# Patient Record
Sex: Male | Born: 1960 | Race: White | Hispanic: No | Marital: Married | Smoking: Never smoker
Health system: Southern US, Community
[De-identification: ages and names within clinical notes are randomized; demographics above are authoritative.]

## PROBLEM LIST (undated history)

## (undated) DIAGNOSIS — N200 Calculus of kidney: Secondary | ICD-10-CM

## (undated) DIAGNOSIS — I10 Essential (primary) hypertension: Secondary | ICD-10-CM

## (undated) HISTORY — PX: HERNIA REPAIR: SHX51

## (undated) HISTORY — PX: NECK SURGERY: SHX720

## (undated) HISTORY — PX: KNEE ARTHROSCOPY: SUR90

## (undated) HISTORY — PX: ROTATOR CUFF REPAIR: SHX139

## (undated) HISTORY — PX: FOOT SURGERY: SHX648

---

## 1999-12-18 ENCOUNTER — Inpatient Hospital Stay (HOSPITAL_COMMUNITY): Admission: RE | Admit: 1999-12-18 | Discharge: 1999-12-19 | Payer: Self-pay | Admitting: Neurosurgery

## 1999-12-18 ENCOUNTER — Encounter: Payer: Self-pay | Admitting: Neurosurgery

## 2000-01-12 ENCOUNTER — Encounter: Admission: RE | Admit: 2000-01-12 | Discharge: 2000-01-12 | Payer: Self-pay | Admitting: Neurosurgery

## 2000-01-12 ENCOUNTER — Encounter: Payer: Self-pay | Admitting: Neurosurgery

## 2000-02-18 ENCOUNTER — Encounter: Admission: RE | Admit: 2000-02-18 | Discharge: 2000-02-18 | Payer: Self-pay | Admitting: Neurosurgery

## 2000-02-18 ENCOUNTER — Encounter: Payer: Self-pay | Admitting: Neurosurgery

## 2000-04-30 ENCOUNTER — Encounter: Admission: RE | Admit: 2000-04-30 | Discharge: 2000-04-30 | Payer: Self-pay | Admitting: Neurosurgery

## 2000-04-30 ENCOUNTER — Encounter: Payer: Self-pay | Admitting: Neurosurgery

## 2001-02-04 ENCOUNTER — Encounter (HOSPITAL_COMMUNITY): Admission: RE | Admit: 2001-02-04 | Discharge: 2001-03-06 | Payer: Self-pay | Admitting: Preventative Medicine

## 2001-07-28 ENCOUNTER — Ambulatory Visit (HOSPITAL_COMMUNITY): Admission: RE | Admit: 2001-07-28 | Discharge: 2001-07-28 | Payer: Self-pay | Admitting: Pulmonary Disease

## 2003-03-12 ENCOUNTER — Encounter: Payer: Self-pay | Admitting: Orthopedic Surgery

## 2003-07-05 ENCOUNTER — Emergency Department (HOSPITAL_COMMUNITY): Admission: EM | Admit: 2003-07-05 | Discharge: 2003-07-05 | Payer: Self-pay | Admitting: Internal Medicine

## 2003-07-06 ENCOUNTER — Ambulatory Visit (HOSPITAL_COMMUNITY): Admission: RE | Admit: 2003-07-06 | Discharge: 2003-07-06 | Payer: Self-pay | Admitting: Pulmonary Disease

## 2003-07-15 ENCOUNTER — Emergency Department (HOSPITAL_COMMUNITY): Admission: EM | Admit: 2003-07-15 | Discharge: 2003-07-16 | Payer: Self-pay | Admitting: *Deleted

## 2004-05-26 ENCOUNTER — Ambulatory Visit: Payer: Self-pay | Admitting: Orthopedic Surgery

## 2004-06-13 ENCOUNTER — Ambulatory Visit (HOSPITAL_COMMUNITY): Admission: RE | Admit: 2004-06-13 | Discharge: 2004-06-13 | Payer: Self-pay | Admitting: Orthopedic Surgery

## 2008-02-13 ENCOUNTER — Encounter: Admission: RE | Admit: 2008-02-13 | Discharge: 2008-02-13 | Payer: Self-pay | Admitting: Neurosurgery

## 2009-06-18 ENCOUNTER — Ambulatory Visit: Payer: Self-pay | Admitting: Orthopedic Surgery

## 2009-06-18 DIAGNOSIS — IMO0002 Reserved for concepts with insufficient information to code with codable children: Secondary | ICD-10-CM | POA: Insufficient documentation

## 2009-06-18 DIAGNOSIS — M1710 Unilateral primary osteoarthritis, unspecified knee: Secondary | ICD-10-CM

## 2009-06-18 DIAGNOSIS — M23302 Other meniscus derangements, unspecified lateral meniscus, unspecified knee: Secondary | ICD-10-CM | POA: Insufficient documentation

## 2009-06-20 ENCOUNTER — Telehealth: Payer: Self-pay | Admitting: Orthopedic Surgery

## 2009-06-21 ENCOUNTER — Ambulatory Visit (HOSPITAL_COMMUNITY): Admission: RE | Admit: 2009-06-21 | Discharge: 2009-06-21 | Payer: Self-pay | Admitting: Orthopedic Surgery

## 2009-06-25 ENCOUNTER — Ambulatory Visit: Payer: Self-pay | Admitting: Orthopedic Surgery

## 2009-06-25 ENCOUNTER — Telehealth: Payer: Self-pay | Admitting: Orthopedic Surgery

## 2009-06-25 DIAGNOSIS — S83419A Sprain of medial collateral ligament of unspecified knee, initial encounter: Secondary | ICD-10-CM | POA: Insufficient documentation

## 2009-06-25 DIAGNOSIS — IMO0002 Reserved for concepts with insufficient information to code with codable children: Secondary | ICD-10-CM | POA: Insufficient documentation

## 2009-06-25 DIAGNOSIS — M2340 Loose body in knee, unspecified knee: Secondary | ICD-10-CM | POA: Insufficient documentation

## 2009-07-03 ENCOUNTER — Ambulatory Visit: Payer: Self-pay | Admitting: Orthopedic Surgery

## 2009-07-03 ENCOUNTER — Ambulatory Visit (HOSPITAL_COMMUNITY): Admission: RE | Admit: 2009-07-03 | Discharge: 2009-07-03 | Payer: Self-pay | Admitting: Orthopedic Surgery

## 2009-07-08 ENCOUNTER — Ambulatory Visit: Payer: Self-pay | Admitting: Orthopedic Surgery

## 2009-07-12 ENCOUNTER — Encounter: Payer: Self-pay | Admitting: Orthopedic Surgery

## 2009-07-18 ENCOUNTER — Encounter: Payer: Self-pay | Admitting: Orthopedic Surgery

## 2009-07-22 ENCOUNTER — Ambulatory Visit: Payer: Self-pay | Admitting: Orthopedic Surgery

## 2009-08-08 ENCOUNTER — Ambulatory Visit: Payer: Self-pay | Admitting: Orthopedic Surgery

## 2009-08-08 ENCOUNTER — Telehealth: Payer: Self-pay | Admitting: Orthopedic Surgery

## 2009-11-13 ENCOUNTER — Ambulatory Visit: Payer: Self-pay | Admitting: Orthopedic Surgery

## 2009-11-13 ENCOUNTER — Telehealth: Payer: Self-pay | Admitting: Orthopedic Surgery

## 2009-11-14 ENCOUNTER — Encounter (INDEPENDENT_AMBULATORY_CARE_PROVIDER_SITE_OTHER): Payer: Self-pay | Admitting: *Deleted

## 2009-11-15 ENCOUNTER — Encounter: Payer: Self-pay | Admitting: Orthopedic Surgery

## 2009-11-25 ENCOUNTER — Ambulatory Visit: Payer: Self-pay | Admitting: Orthopedic Surgery

## 2009-12-02 ENCOUNTER — Ambulatory Visit: Payer: Self-pay | Admitting: Orthopedic Surgery

## 2009-12-09 ENCOUNTER — Ambulatory Visit: Payer: Self-pay | Admitting: Orthopedic Surgery

## 2010-01-23 ENCOUNTER — Telehealth: Payer: Self-pay | Admitting: Orthopedic Surgery

## 2010-05-20 NOTE — Assessment & Plan Note (Signed)
Summary: SYNVISC # 2 INJ/BCBS/BSF   Visit Type:  Follow-up  CC:  knee pain.  History of Present Illness: I saw Eddie Salazar in the office today for a followup visit.  He is a 50 years old man with the complaint of:  left knee  Synvisc injection #2 today.  Doing better.   He is doing well with his first injection no complications he says his knee feels better  We injected the LEFT knee for the second second time with Synvisc  1 vial of synvisc was injected into the left  knee.              Allergies: No Known Drug Allergies   Impression & Recommendations:  Problem # 1:  KNEE, ARTHRITIS, DEGEN./OSTEO (ICD-715.96) Assessment Improved  Orders: Synvisc (20610M)  Patient Instructions: 1)  You have received an injection of cortisone today. You may experience increased pain at the injection site. Apply ice pack to the area for 20 minutes every 2 hours and take 2 xtra strength tylenol every 8 hours. This increased pain will usually resolve in 24 hours. The injection will take effect in 3-10 days.  2)  3rd injection next week

## 2010-05-20 NOTE — Progress Notes (Signed)
Summary: Initial evaluation  Initial evaluation   Imported By: Jacklynn Ganong 06/18/2009 08:45:35  _____________________________________________________________________  External Attachment:    Type:   Image     Comment:   External Document

## 2010-05-20 NOTE — Letter (Signed)
Summary: Out of Work  [facility]  [address]  [place], [place] [address]   Phone: [phone]  Fax: [phone]    July 22, 2009   Employee:  Eddie Salazar    To Whom It May Concern:   For Medical reasons, please excuse the above named employee from work for the following dates:  Continue OOW to April 24  If you need additional information, please feel free to contact our office.         Sincerely,    Fuller Canada MD

## 2010-05-20 NOTE — Assessment & Plan Note (Signed)
Summary: LT KNEE STIFFNESS/SURG 07/03/09/BCBS/CAF   Visit Type:  Follow-up  CC:  left knee stiffness.  History of Present Illness: A 50 year old male status post arthroscopy LEFT knee medial lateral meniscectomies medial femoral condyle chondroplasty complains of knee stiffness at the end of the day and mild medial joint line pain.  DOS 07-03-09.   Medications: he takes Aleve as needed which may help some what  last x-ray was June 18, 2009  He does not complain of catching locking or giving way of stiffness when he crosses his legs or when he flexes his knee    Allergies: No Known Drug Allergies  Physical Exam  Extremities:  LEFT knee and he walks normally  He has medial joint line tenderness has clicking and popping on range of motion his range of motion remains full except for 5 loss of extension his motor strength is grade 5 his knee is stable his meniscal signs are negative his pulse is good his sensation is normal   Impression & Recommendations:  Problem # 1:  KNEE, ARTHRITIS, DEGEN./OSTEO (ICD-715.96) Assessment Comment Only  osteoarthritis worsening recommend Synvisc injections as well as LEFT knee joint injection  Verbal consent was obtained. The knee was prepped with alcohol and ethyl chloride. 1 cc of depomedrol 40mg /cc and 4 cc of lidocaine 1% was injected. there were no complications.  Orders: Est. Patient Level III ([address]) Joint Aspirate / Injection, Large (20610) Depo- Medrol 40mg  (J1030)  Patient Instructions: 1)  You have received an injection of cortisone today. You may experience increased pain at the injection site. Apply ice pack to the area for 20 minutes every 2 hours and take 2 xtra strength tylenol every 8 hours. This increased pain will usually resolve in 24 hours. The injection will take effect in 3-10 days.  2)  take script to pharmacy and call us when you get it to have the medicine injected over 3 weeks time

## 2010-05-20 NOTE — Letter (Signed)
Summary: FMLA form  FMLA form   Imported By: Jacklynn Ganong 08/06/2009 10:27:14  _____________________________________________________________________  External Attachment:    Type:   Image     Comment:   External Document

## 2010-05-20 NOTE — Medication Information (Signed)
Summary: RX Authorization Synv  RX Authorization Synv   Imported By: Cammie Sickle 11/15/2009 11:34:29  _____________________________________________________________________  External Attachment:    Type:   Image     Comment:   External Document

## 2010-05-20 NOTE — Letter (Signed)
Summary: Work note  [facility]  [address]  [place], [place] [address]   Phone: [phone]  Fax: [phone]    June 18, 2009   Employee:  Eddie Salazar    To Whom It May Concern:   For Medical reasons, please note above named employee was seen in our office today for an appointment, June 18, 2009.   Please note the following:  Start:   June 18, 2009 - No lifting on the truck.  End:   Until further notice.  If you need additional information, please feel free to contact our office.         Sincerely,   Terrance Mass, MD

## 2010-05-20 NOTE — Assessment & Plan Note (Signed)
Summary: MRI results from ap/frs   Visit Type:  Follow-up  CC:  left knee pain.  History of Present Illness: This is a 50 year old male with previous history of osteochondritis dissecans of the LEFT knee treated in 1981 at [facility]  with a graft/open treatment internal fixation procedure and presents now with injury 2 weeks ago, while at work. He was walking slid lost his footing and injured his LEFT knee.  He complains of medial pain. Minimal swelling painful weightbearing. No radiation. The pain is sharp moderate in severity and constant when he gets out of a seated position and starts walking. He has catching.  We injected his knee and gave him a brace to wear and that helped him over the last week but he still has basically the same symptoms.  His MRI shows:  1.  Possible grade 1 sprain of the MCL. 2.  Extensive chondromalacia of the knee joint with multiple loose bodies in the joint including a loose body lying anterior and under the posterior horn of the lateral meniscus. 3.  Horizontal tear of the undersurface of the mid body and posterior horn of the medial meniscus. 4.   there is also 3 compartment arthritis    He needs to have arthroscopic surgery we discussed risk benefit ratio he decided to go ahead with the surgery    Allergies: No Known Drug Allergies  Past History:  Past Medical History: Last updated: 06/18/2009 None  Past Surgical History: Last updated: 06/18/2009 Neck Knee left Foot right shoulder  Family History: FH of Cancer:   Social History: Patient is married.   Works at [employer] and does a lot of lifting  Review of Systems Musculoskeletal:  See HPI.  The review of systems is negative for Constitutional, Cardiovascular, Respiratory, Gastrointestinal, Genitourinary, Neurologic, Endocrine, Psychiatric, Skin, HEENT, Immunology, and Hemoatologic.  Physical Exam  Additional Exam:  GEN: appearance was normal   CDV:  normal pulses temperature and no edema  LYMPH nodes were normal   SKIN was normal   Neuro: normal sensation Psyche: AAO x 3 and mood was normal   MSK *Gait was abnormal, he is limping  Inspection reveals that the LEFT knee has a joint effusion. His flexion is 120. He has significant medial joint line tenderness. A slightly positive McMurray sign, a positive hyper extension sign. He doesn't come to full extension with a 5 block. His motor exam is normal. His knee is stable.  His RIGHT knee is nontender with full range of motion, grade 5 strength, and no joint laxity   The upper extremities have normal appearance, ROM, no joint laxity and mild supraspinatus weakness in the RIGHT shoulder related to her previous cuff repair it is of no functional significance   Impression & Recommendations:  Problem # 1:  KNEE, ARTHRITIS, DEGEN./OSTEO (EAV-409.81)  Orders: Est. Patient Level III (19147)  Problem # 2:  TEAR MEDIAL MENISCUS (ICD-836.0)  Orders: Est. Patient Level III (82956)  Problem # 3:  TEAR M C L (ICD-844.1)  Orders: Est. Patient Level III (21308)  Problem # 4:  LOOSE BODY-KNEE (ICD-717.6)  plan arthroscopy LEFT knee partial medial meniscectomy and removal of loose bodies.  No plan to address any arthritic changes unless there are chondral flap tears.  Orders: Est. Patient Level III (65784)  Patient Instructions: 1)  Schedule post op visit

## 2010-05-20 NOTE — Progress Notes (Signed)
Summary: [facility] Rx question  Phone Note From Pharmacy   Caller: Pharmacy Summary of Call: Rhi from [facility] called to verify Synvisc Rx; asked if pre-filled syringes okay?  Also verify'g strength. States they will be running his insurance after confirming Rx Initial call taken by: Cammie Sickle,  November 13, 2009 11:43 AM  Follow-up for Phone Call        per Dr Romeo Apple -pre-filled syringes okay and need  3 pins of 2 ml's. Follow-up by: Cammie Sickle,  November 13, 2009 11:50 AM

## 2010-05-20 NOTE — Assessment & Plan Note (Signed)
Summary: RE-CK/POST OP LT KNEE/SURG 07/03/09/BCBS/CAF   Visit Type:  Follow-up  CC:  post op left knee.Eddie Salazar  History of Present Illness: a 50 years old status post arthroscopy LEFT knee medial and lateral meniscectomies and a chondroplasty of the medial femoral condyle DOS 07-03-09. Arthoscopy, left knee: medial and lateral menisectomies, MFC PLASTY  Medications: none.  Doing well.  Is ready for work.  Normal knee exam    Allergies: No Known Drug Allergies   Other Orders: Post-Op Check (09811)  Patient Instructions: 1)  Please schedule a follow-up appointment as needed. 2)  return to work

## 2010-05-20 NOTE — Progress Notes (Signed)
Summary: cancelled for today  Phone Note Call from Patient   Summary of Call: Eddie Salazar says his knee is doing fine, he is in class now and not on his feet as much.  He cancelled the appointment for today because he does not have any insurance.  He will be covered by mid December and said if you need him to follow-up he will schedule after insurance goes into effect. Initial call taken by: Jacklynn Ganong,  January 23, 2010 7:57 AM

## 2010-05-20 NOTE — Miscellaneous (Signed)
Summary: authorization approved for synvisc exp 12/14/09  Clinical Lists Changes

## 2010-05-20 NOTE — Progress Notes (Signed)
Summary: Progress note  Progress note   Imported By: Jacklynn Ganong 06/18/2009 08:46:26  _____________________________________________________________________  External Attachment:    Type:   Image     Comment:   External Document

## 2010-05-20 NOTE — Letter (Signed)
Summary: surgery order LT knee sched 07/03/09  surgery order LT knee sched 07/03/09   Imported By: Cammie Sickle 10/12/2009 13:29:40  _____________________________________________________________________  External Attachment:    Type:   Image     Comment:   External Document

## 2010-05-20 NOTE — Assessment & Plan Note (Signed)
Summary: sYNVISC #3 INJECTION/BCBS/BSF   Visit Type:  Follow-up  CC:  knee pain.  History of Present Illness: 50 year old male for his third Synvisc injection today.  The medicine was ordered from the pharmacy.  He reports that his knee is much better.  He said he did try to run up some stairs and had a little difficulty doing that but otherwise his knee has been feeling great.   third Synvisc injection was given lateral approach LEFT knee sterile conditions no complications          Allergies: No Known Drug Allergies   Impression & Recommendations:  Problem # 1:  KNEE, ARTHRITIS, DEGEN./OSTEO (ICD-715.96) Assessment Improved  1 vial of synvisc was injected into the knee.  Orders: Joint Aspirate / Injection, Large (20610) Synvisc (20610M)  Patient Instructions: 1)  Return in 6 weeks [post 3rd synvisc injection] 2)  Try the plantar fasciitis exercises

## 2010-05-20 NOTE — Medication Information (Signed)
Summary: Tax adviser   Imported By: Cammie Sickle 11/15/2009 11:32:55  _____________________________________________________________________  External Attachment:    Type:   Image     Comment:   External Document

## 2010-05-20 NOTE — Progress Notes (Signed)
Summary: wants to rtw 08/10/09  Phone Note Call from Patient   Summary of Call: Eddie Salazar (21-Oct-2060)asked if he can return to work Saturday 08/10/09 instead of 08/12/09. Will only be working 6 hours on 08/10/09 and will resume  regular hours 08/12/09. If ok'd asked [place] to fax the note to Gerrit Friends at [facility] [employer] improvement fax # [phone]. Benjimin's # [id number] Initial call taken by: Jacklynn Ganong,  August 08, 2009 10:37 AM  Follow-up for Phone Call        ok Follow-up by: Fuller Canada MD,  August 08, 2009 10:42 AM

## 2010-05-20 NOTE — Letter (Signed)
Summary: Out of Work Updated  [facility]  [address]  [place], [place] [address]   Phone: [phone]  Fax: [phone]    August 08, 2009   Employee:  Eddie Salazar    To Whom It May Concern:   For Medical reasons, please note that the above named employee has been released to return to work as follows:  August 10, 2009, no restrictions, full duty   If you need additional information, please feel free to contact our office.         Sincerely,    Terrance Mass, MD

## 2010-05-20 NOTE — Assessment & Plan Note (Signed)
Summary: SYNVISC INJEC #1 LT KNEE/BCBS/CAF   Visit Type:  Follow-up  CC:  left knee pain.  History of Present Illness: I saw Eddie Salazar in the office today for a followup visit.  He is a 50 years old man with the complaint of:  left knee  Synvisc injection #1 today.  The patient is having pain and loss of function in the knee. Other conservative measures have failed (nsaids, rest, exercise, attempts at wt loss, arthroscopic surgery)   Normal Appearance, Oriented x 3, Mood normal Inspection medial joint line tenderness   ROM 5-125   Stability was normal   Motor grade was 5  Skin was intact no rash     1 vial of synvisc was injected into the LEFT knee.    Allergies: No Known Drug Allergies   Impression & Recommendations:  Problem # 1:  KNEE, ARTHRITIS, DEGEN./OSTEO (ICD-715.96) Assessment Unchanged  Orders: Joint Aspirate / Injection, Large ([address]) Synvisc (20610M) Synvisc-Three (16 units) (U0454)  Patient Instructions: 1)  Make next 2 appointments now [each 1 week apart]

## 2010-05-20 NOTE — Assessment & Plan Note (Signed)
Summary: POST OP 1/LT KNEE SURG 07/03/09/BCBS/CAF   Visit Type:  post op #1  CC:  post op left knee.  History of Present Illness: POST OP VISIT DAY # 5  DOS 07-03-09. Arthoscopy, left knee  Medications: Hydrocodone  C/O SORENESS EVERY WHERE EXCEPT THE KNEE , THE KNEE IS STIFF     He has a small flexion contracture about 5-7, has a small effusion. His flexion is about 100  the knee looks good otherwise.  In home exercise program, including, prone hangs, follow up 2 weeks    The stitches were removed from the knee. There is a small joint effusion. He has a chronic7 flexion contracture.   Allergies: No Known Drug Allergies   Impression & Recommendations:  Problem # 1:  KNEE, ARTHRITIS, DEGEN./OSTEO (ICD-715.96) Assessment Comment Only  Problem # 2:  TEAR MEDIAL MENISCUS (ICD-836.0) Assessment: Comment Only  postop stable  Other Orders: Post-Op Check ([address])  Patient Instructions: 1)  Home exercise program  2)  return in 2 weeks

## 2010-05-20 NOTE — Letter (Signed)
Summary: Out of Work  [facility]  [address]  [place], [place] [address]   Phone: [phone]  Fax: [phone]    August 08, 2009   Employee:  SONIA STICKELS    To Whom It May Concern:   For Medical reasons, Return to work   August 12, 2009 with no restrictions    If you need additional information, please feel free to contact our office.         Sincerely,    Fuller Canada MD

## 2010-05-20 NOTE — Assessment & Plan Note (Signed)
Summary: POST OP 2/2 WK RE-CK LT KNEE/BCBS/CAF   Visit Type:  post op  CC:  left knee.  History of Present Illness: I saw Eddie Salazar in the office today for a 3 week  post op followup visit.   DOS 07-03-09. Arthoscopy, left knee: medial and lateral menisectomies, MFC PLASTY  Medications: none.  c/o giving way and stairs are difficult.  ROM is better full flexion and has a mild extension deficit  Eddie Salazar is doing well. He is improving. However, yesterday 100%. He goes back to work, which requires bending, lifting, and standing and lifting heavy equipment so I think he would need another 3, weeks. I'll see him on the 21st of reassess    Allergies: No Known Drug Allergies   Other Orders: Post-Op Check (81191)  Patient Instructions: 1)  April 21 follow up  2)  continue exercises at home  3)  wear sleeve on left leg

## 2010-05-20 NOTE — Letter (Signed)
Summary: Out of Work Letter 07/08/09  [facility]  [address]  [place], [place] [address]   Phone: [phone]  Fax: [phone]    July 08, 2009   Employee:  Eddie Salazar    To Whom It May Concern:   For Medical reasons, please excuse the above named employee from work for the following dates:  Start:   July 03, 2009, secondary to surgery  End/Estimate Return to work date:  August 14, 2009   (Next scheduled appointment:  July 22, 2009)    If you need additional information, please feel free to contact our office.         Sincerely,    Terrance Mass, MD

## 2010-05-20 NOTE — Letter (Signed)
Summary: History form  History form   Imported By: Jacklynn Ganong 06/25/2009 09:53:33  _____________________________________________________________________  External Attachment:    Type:   Image     Comment:   External Document

## 2010-05-20 NOTE — Assessment & Plan Note (Signed)
Summary: KNEE PAIN NEEDS XR/BCBS/BSF   Visit Type:  new name  CC:  left knee pain.  History of Present Illness: this is a 50 year old male with a history of osteochondritis dissecans of the LEFT knee [facility] in [facility] with a graft/open treatment internal fixation. Procedure and presents now with injury 2 weeks ago, while at work. He was walking slid lost his footing and injured his LEFT knee.  He complains of medial pain. Minimal swelling painful weightbearing. No radiation. The pain is sharp moderate in severity and constant when he gets out of a seated position and starts walking. He has catching.   Xrays today in our office.  Medications: Aleve when needed.  Allergies: No Known Drug Allergies  Past History:  Past Surgical History: Neck Knee left Foot right shoulder  Review of Systems Constitutional:  Denies weight loss, weight gain, fever, chills, and fatigue. Cardiovascular:  Denies chest pain, angina, heart attack, heart failure, poor circulation, blood clots, and phlebitis. Respiratory:  Denies short of breath, difficulty breathing, COPD, cough, and pneumonia. Gastrointestinal:  Denies nausea, vomiting, diarrhea, constipation, difficulty swallowing, ulcers, GERD, and reflux. Genitourinary:  Denies kidney failure, kidney transplant, kidney stones, burning, poor stream, testicular cancer, blood in urine, and . Neurologic:  Denies headache, dizziness, migraines, numbness, weakness, tremor, and unsteady walking. Musculoskeletal:  Complains of joint pain and joint swelling; denies rheumatoid arthritis, gout, bone cancer, osteoporosis, and . Endocrine:  Denies thyroid disease, goiter, and diabetes. Psychiatric:  Denies depression, mood swings, anxiety, panic attack, bipolar, and schizophrenia. Skin:  Denies eczema, cancer, and itching. HEENT:  Denies poor vision, cataracts, glaucoma, poor hearing, vertigo, ears ringing, sinusitis, hoarseness, toothaches, and  bleeding gums. Immunology:  Denies seasonal allergies, sinus problems, and allergic to bee stings. Hemoatologic:  Denies lymph node cancer and lymph edema.  Physical Exam  Additional Exam:  GEN: appearance was normal   CDV: normal pulses temperature and no edema  LYMPH nodes were normal   SKIN was normal   Neuro: normal sensation Psyche: AAO x 3 and mood was normal   MSK *Gait was abnormal, he is limping  Inspection reveals that the LEFT knee has a joint effusion. His flexion is 120. He has significant medial joint line tenderness. A slightly positive McMurray sign, a positive hyper extension sign. He doesn't come to full extension with a 5 block. His motor exam is normal. His knee is stable.  His RIGHT knee is nontender with full range of motion, grade 5 strength, and no joint laxity      Impression & Recommendations:  Problem # 1:  DERANGEMENT MENISCUS (ICD-717.5) Assessment New  Orders: New Patient Level III ([address]) Knee x-ray,  3 views (73562)/ordered.  He has squaring of the medial femoral condyle. It was enlarged. This questionable halo aside from previous OCD surgery. He has maintained his joint spaces with mild medial joint space narrowing and some osteophytes medially.  Impression medial gonarthrosis.  Recommend MRI to evaluate for meniscal tear  Problem # 2:  KNEE, ARTHRITIS, DEGEN./OSTEO (ICD-715.96) Assessment: New  Orders: New Patient Level III (60454) Knee x-ray,  3 views (09811)  Patient Instructions: 1)  MRI LEFT KNEE  2)  NOTE FOR WORK: NO LIFTING ON THE TRUCK  3)  APPLY ICE TO THE KNEE three times a day  4)  FOR 30 MINUTES [OR AS MANY TIMES AS YOU CAN]

## 2010-05-20 NOTE — Progress Notes (Signed)
Summary: No pre-authorization required for out-patient procedure  Phone Note Outgoing Call   Call placed to: Insurer Summary of Call: Contacted insurer [employer] re: out-patient procedure scheduled 07/03/09 at [facility] LT knee, CPT 29881/29880. No pre-cert is required for out-patient surgery per Laquita. Initial call taken by: Cammie Sickle,  June 25, 2009 6:50 PM

## 2010-05-20 NOTE — Progress Notes (Signed)
Summary: MRI appointment.  Phone Note Outgoing Call   Call placed by: Waldon Reining,  June 20, 2009 3:14 PM Call placed to: Patient Action Taken: Phone Call Completed, Appt scheduled Summary of Call: I called to give the patient his MRI appointment at [facility] on 06-21-09 at 7:30 pm. Patient has BCBS, no precert is needed. Patient will follow up back here for his results.

## 2010-07-13 LAB — BASIC METABOLIC PANEL
BUN: 10 mg/dL (ref 6–23)
CO2: 28 mEq/L (ref 19–32)
Chloride: 102 mEq/L (ref 96–112)
Creatinine, Ser: 0.86 mg/dL (ref 0.4–1.5)
Glucose, Bld: 120 mg/dL — ABNORMAL HIGH (ref 70–99)
Potassium: 3.8 mEq/L (ref 3.5–5.1)

## 2010-09-05 NOTE — H&P (Signed)
[place]  Patient:    Eddie Salazar, Eddie Salazar                      MRN: [id number] Adm. Date:  [id number] Attending:  Barton Fanny CC:         Hewitt Shorts, M.D.   History and Physical  CHIEF COMPLAINT: The patient is a 50 year old right-handed white male evaluated for left cervical radiculopathy secondary to cervical disk herniation.  HISTORY OF PRESENT ILLNESS: He began to have symptoms over the last couple of months, without any particular cause of onset.  He began to notice weakness in his left upper extremity as well as numbness and tingling in his left thumb that gradually extended to the left index finger as well.  At work he often has to do lifting with both his upper extremities and he has found that he is able to lift much better with his right than his left upper extremity.  He has had some neck stiffness occasionally with coughing and will have sharp pain that will shoot down and around the left scapula and down through the left shoulder, arm, forearm, and hand.  He has discomfort and pain in the base of his neck.  He denies any right upper extremity symptoms.  He has been taking Relafen without relief or improvement.  PAST MEDICAL HISTORY: No history of hypertension, myocardial infarction, cancer, stroke, diabetes, peptic ulcer disease, or lung disease.  PAST SURGICAL HISTORY:  1. Left knee arthroscopy.  2. ORIF of left femur.  3. Repair of left foot tendon and artery injury from a chain saw accident.  4. Right bunionectomy.  5. Bilateral inguinal herniorrhaphy.  ALLERGIES: No known drug allergies.  CURRENT MEDICATIONS: None.  FAMILY HISTORY: Parents are both in good health at age 84.  His mother has hypertension and his father has a history of colon cancer.  There is a family history of hypertension, stroke, and cancer.  SOCIAL HISTORY: The patient is married.  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  He does not smoke.  He does drink alcoholic beverages socially.  He denies history of substance abuse.  REVIEW OF SYSTEMS: Notable for this difficulties described in his History of Present Illness and past medical history, but otherwise unremarkable.  PHYSICAL EXAMINATION:  GENERAL: The patient is a well-developed, well-nourished white male, in no acute distress.  VITAL SIGNS: Temperature 97.2 degrees, pulse 76, blood pressure 140/92, respiratory rate 18.  Height 5 feet 9 inches.  Weight 170 pounds.  LUNGS: Clear to auscultation.  Symmetrical respiratory excursions.  HEART: Regular rate and rhythm.  Normal S1 and S2.  No murmur.  ABDOMEN: Soft, nontender.  Bowel sounds present.  EXTREMITIES: No clubbing, cyanosis, or edema.  MUSCULOSKELETAL: No tenderness to palpation of the cervical spinous processes or paracervical musculature.  Good range of motion of the neck, with some discomfort at the base of his neck with forward flexion and minimal discomfort on lateral flexion to either side.  NEUROLOGIC: Weakness of left triceps, 3/5.  Remainder of upper extremity strength is 5/5 at deltoids and biceps bilaterally as well as right triceps, as well as intrinsics and grip bilaterally.  Sensation diminished to pinprick in  the first and second digits of the left hand but otherwise intact. Reflexes are minimal in the upper extremities except for the right biceps, which is trace to 1.  The quadriceps are trace bilaterally, gastrocnemius minimal bilaterally.  Toes downgoing bilaterally.  LABORATORY DATA: X-rays and MRI scan were reviewed and show mild degenerative disk disease and spondylosis.  The most significant finding on MRI, though, is of central  to left disk herniation at both C5-6 and C6-7.  IMPRESSION: Left C6 and C7 radiculopathy as evidenced by numbness of the first and second digits of the left hand and weakness of the left triceps, secondary to left C5-6 and C6-7 cervical disk herniation superimposed on mild degenerative disk disease and spondylosis.  PLAN: The patient is being admitted for two-level, C5-6 and C6-7, anterior cervical diskectomy and arthrodesis with allograft and cervical plating.  We discussed the typical nature of surgery, length of surgery and hospital stay, and overall recuperation.  We discussed limitations during the postoperative period and the risks of surgery including the risk of infection, bleeding, possible need for transfusion, risk of nerve dysfunction with pain, weakness, numbness, or paresthesias, the risk of spinal cord dysfunction with paralysis of all four limbs or quadriplegia, risk of failure of arthrodesis, anesthetic risks of myocardial infarction, stroke, pneumonia, and death, and understanding all this the patient does wish to proceed with surgery, and is admitted for such. DD:  12/18/99 TD:  12/18/99 Job: [id number]

## 2010-09-05 NOTE — Procedures (Signed)
[facility]  Patient:    Eddie Salazar, Eddie Salazar Visit Number: [id number] MRN: [id number]          Service Type: Attending:  Kari Baars, M.D. Dictated by:   Kari Baars, M.D. Proc. Date: 07/28/01 Disc. Date: 07/28/01                                Stress Test  REASON FOR PROCEDURE:  Chest pain.  BRIEF HISTORY:  The patient has been having chest discomfort and shortness of breath associated with exertion.  He is undergoing graded exercise testing to rule out ischemic cardiac disease. There are no contraindications to grade exercise testing.  The patient exercised for 10 minutes and 15 seconds on the Bruce Protocol reaching sustaining 12.9 minutes.  His maximum recorded heart rate was 173 which was 96% of his age predicted maximal heart rate.  He had no symptoms during exercise.  His blood pressure response to exercise was slightly exaggerated.  There were no electrocardiographic changes suggestive of inducible ischemia.  IMPRESSION: 1. Good exercise tolerance. 2. Somewhat exaggerated blood pressure response to exercise. 3. No symptoms during exercise. 4. No evidence of inducible ischemia. Dictated by:   Kari Baars, M.D. Attending:  Kari Baars, M.D. DD:  07/28/01 TD:  07/30/01 Job: [id number]

## 2011-08-27 ENCOUNTER — Encounter: Payer: Self-pay | Admitting: Internal Medicine

## 2011-08-28 ENCOUNTER — Encounter (INDEPENDENT_AMBULATORY_CARE_PROVIDER_SITE_OTHER): Payer: Self-pay | Admitting: *Deleted

## 2011-11-23 ENCOUNTER — Other Ambulatory Visit (INDEPENDENT_AMBULATORY_CARE_PROVIDER_SITE_OTHER): Payer: Self-pay | Admitting: *Deleted

## 2011-11-23 ENCOUNTER — Encounter (INDEPENDENT_AMBULATORY_CARE_PROVIDER_SITE_OTHER): Payer: Self-pay | Admitting: *Deleted

## 2011-11-23 ENCOUNTER — Telehealth (INDEPENDENT_AMBULATORY_CARE_PROVIDER_SITE_OTHER): Payer: Self-pay | Admitting: *Deleted

## 2011-11-23 DIAGNOSIS — Z1211 Encounter for screening for malignant neoplasm of colon: Secondary | ICD-10-CM

## 2011-11-23 DIAGNOSIS — Z800 Family history of malignant neoplasm of digestive organs: Secondary | ICD-10-CM

## 2011-11-23 MED ORDER — PEG-KCL-NACL-NASULF-NA ASC-C 100 G PO SOLR: 1.0000 | Freq: Once | ORAL | Status: DC

## 2011-11-23 NOTE — Telephone Encounter (Signed)
Patient needs movi prep 

## 2011-12-16 ENCOUNTER — Telehealth (INDEPENDENT_AMBULATORY_CARE_PROVIDER_SITE_OTHER): Payer: Self-pay | Admitting: *Deleted

## 2011-12-16 NOTE — Telephone Encounter (Signed)
PCP/Requesting MD: Juanetta Gosling  Name & DOB: yahia bottger 04/19/2061     Procedure: tcs  Reason/Indication:  Screening, fam hx colon ca  Has patient had this procedure before?  yes  If so, when, by whom and where?  At least 8-10 yrs ago  Is there a family history of colon cancer?  yes  Who?  What age when diagnosed?  Father, grandfather  Is patient diabetic?   no      Does patient have prosthetic heart valve?  no  Do you have a pacemaker?  no  Has patient had joint replacement within last 12 months?  no  Is patient on Coumadin, Plavix and/or Aspirin? no  Medications: lisinopril 10 mg daily  Allergies: nkda  Medication Adjustment:   Procedure date & time: 01/13/12 at 830

## 2011-12-18 NOTE — Telephone Encounter (Signed)
agree

## 2012-01-08 ENCOUNTER — Encounter (HOSPITAL_COMMUNITY): Payer: Self-pay | Admitting: Pharmacy Technician

## 2012-01-12 MED ORDER — SODIUM CHLORIDE 0.45 % IV SOLN
Freq: Once | INTRAVENOUS | Status: AC
  Administered 2012-01-13: 1000 mL via INTRAVENOUS

## 2012-01-13 ENCOUNTER — Encounter (HOSPITAL_COMMUNITY): Payer: Self-pay | Admitting: *Deleted

## 2012-01-13 ENCOUNTER — Encounter (HOSPITAL_COMMUNITY)
Admission: RE | Disposition: A | Discharge status: Home / Self Care | Payer: Self-pay | Source: Ambulatory Visit | Attending: Internal Medicine

## 2012-01-13 ENCOUNTER — Ambulatory Visit (HOSPITAL_COMMUNITY)
Admission: RE | Admit: 2012-01-13 | Discharge: 2012-01-13 | Disposition: A | Discharge status: Home / Self Care | Payer: BC Managed Care – PPO | Source: Ambulatory Visit | Attending: Internal Medicine | Admitting: Internal Medicine

## 2012-01-13 DIAGNOSIS — Z800 Family history of malignant neoplasm of digestive organs: Secondary | ICD-10-CM

## 2012-01-13 DIAGNOSIS — Z1211 Encounter for screening for malignant neoplasm of colon: Secondary | ICD-10-CM | POA: Insufficient documentation

## 2012-01-13 DIAGNOSIS — I10 Essential (primary) hypertension: Secondary | ICD-10-CM | POA: Insufficient documentation

## 2012-01-13 DIAGNOSIS — K644 Residual hemorrhoidal skin tags: Secondary | ICD-10-CM | POA: Insufficient documentation

## 2012-01-13 DIAGNOSIS — D128 Benign neoplasm of rectum: Secondary | ICD-10-CM | POA: Insufficient documentation

## 2012-01-13 DIAGNOSIS — D129 Benign neoplasm of anus and anal canal: Secondary | ICD-10-CM | POA: Insufficient documentation

## 2012-01-13 HISTORY — PX: COLONOSCOPY: SHX5424

## 2012-01-13 HISTORY — DX: Essential (primary) hypertension: I10

## 2012-01-13 SURGERY — COLONOSCOPY
Anesthesia: Moderate Sedation

## 2012-01-13 MED ORDER — STERILE WATER FOR IRRIGATION IR SOLN
Status: DC | PRN
  Administered 2012-01-13: 08:00:00

## 2012-01-13 MED ORDER — MIDAZOLAM HCL 5 MG/5ML IJ SOLN
INTRAMUSCULAR | Status: AC
  Filled 2012-01-13: qty 10

## 2012-01-13 MED ORDER — MIDAZOLAM HCL 5 MG/5ML IJ SOLN
INTRAMUSCULAR | Status: DC | PRN
  Administered 2012-01-13 (×4): 2 mg via INTRAVENOUS

## 2012-01-13 MED ORDER — MEPERIDINE HCL 50 MG/ML IJ SOLN
INTRAMUSCULAR | Status: DC | PRN
  Administered 2012-01-13 (×2): 25 mg via INTRAVENOUS

## 2012-01-13 MED ORDER — MEPERIDINE HCL 50 MG/ML IJ SOLN
INTRAMUSCULAR | Status: AC
  Filled 2012-01-13: qty 1

## 2012-01-13 NOTE — Op Note (Signed)
COLONOSCOPY PROCEDURE REPORT  PATIENT:  [name]  MR#:  [id number] Birthdate:  March 09, 1961, 51 y.o., male Endoscopist:  Dr. Malissa Hippo, MD Referred By:  Dr. Oneal Deputy. Juanetta Gosling, MD Procedure Date: 01/13/2012  Procedure:   Colonoscopy  Indications:  Patient is 50 year old Caucasian male who is undergoing either screening colonoscopy. His last exam was over 8 years ago. Family she is positive for colon carcinoma in his father was diagnosed in his 23s. He is doing fine several years later. Paternal grandfather died of colon carcinoma.  Informed Consent:  The procedure and risks were reviewed with the patient and informed consent was obtained.  Medications:  Demerol 50 mg IV Versed 8 mg IV  Description of procedure:  After a digital rectal exam was performed, that colonoscope was advanced from the anus through the rectum and colon to the area of the cecum, ileocecal valve and appendiceal orifice. The cecum was deeply intubated. These structures were well-seen and photographed for the record. From the level of the cecum and ileocecal valve, the scope was slowly and cautiously withdrawn. The mucosal surfaces were carefully surveyed utilizing scope tip to flexion to facilitate fold flattening as needed. The scope was pulled down into the rectum where a thorough exam including retroflexion was performed. Terminal ileum was also examined.  Findings:   Prep excellent. Normal mucosa of terminal ileum. Small polyp ablated via cold biopsy from rectum. Small hemorrhoids below the dentate line.   Therapeutic/Diagnostic Maneuvers Performed:  See above  Complications:  None  Cecal Withdrawal Time:  11 minutes  Impression:  Normal terminal ileum. Small rectal polyp ablated via cold biopsy. Small external hemorrhoids.  Recommendations:  Standard instructions given. I will contact patient with biopsy results. He should consider next examination in 5 years.  REHMAN,NAJEEB U  01/13/2012  9:08 AM  CC: Dr. Fredirick Maudlin, MD & Dr. Bonnetta Barry ref. provider found

## 2012-01-13 NOTE — H&P (Signed)
Eddie Salazar is an 51 y.o. male.   Chief Complaint: Patient is here for colonoscopy. HPI: Patient is 51 year old Caucasian male who is here for screening colonoscopy. He denies abdominal pain change his bowel habits or rectal bleeding. His appetite is great and stable. His last colonoscopy was over 8 years ago. His father was diagnosed with colon carcinoma when he was in his 16s and doing fine several years later. Paternal grandfather died of colon carcinoma  Past Medical History  Diagnosis Date  . Hypertension     Past Surgical History  Procedure Date  . Hernia repair   . Back surgery     History reviewed. No pertinent family history. Social History:  reports that he has never smoked. He has never used smokeless tobacco. He reports that he does not drink alcohol or use illicit drugs.  Allergies: No Known Allergies  Medications Prior to Admission  Medication Sig Dispense Refill  . lisinopril (PRINIVIL,ZESTRIL) 10 MG tablet Take 10 mg by mouth daily.      . peg 3350 powder (MOVIPREP) 100 G SOLR Take 1 kit (100 g total) by mouth once.  1 kit  0    No results found for this or any previous visit (from the past 48 hour(s)). No results found.  ROS  Blood pressure 135/94, pulse 82, temperature 98 F (36.7 C), temperature source Oral, resp. rate 18, height 5\' 10"  (1.778 m), weight 175 lb (79.379 kg), SpO2 100.00%. Physical Exam  Constitutional: He appears well-developed and well-nourished.  HENT:  Mouth/Throat: Oropharynx is clear and moist.  Eyes: Conjunctivae normal are normal. No scleral icterus.  Neck: No thyromegaly present.  Cardiovascular: Normal rate, regular rhythm and normal heart sounds.   No murmur heard. Respiratory: Effort normal and breath sounds normal.  GI: Soft. He exhibits no distension and no mass. There is no tenderness.  Musculoskeletal: He exhibits no edema.  Lymphadenopathy:    He has no cervical adenopathy.  Neurological: He is alert.  Skin: Skin  is warm and dry.     Assessment/Plan High-risk screening colonoscopy.  Eddie Salazar U 01/13/2012, 8:31 AM

## 2012-01-20 ENCOUNTER — Encounter (HOSPITAL_COMMUNITY): Payer: Self-pay | Admitting: Internal Medicine

## 2012-01-21 ENCOUNTER — Encounter (INDEPENDENT_AMBULATORY_CARE_PROVIDER_SITE_OTHER): Payer: Self-pay | Admitting: *Deleted

## 2012-01-30 ENCOUNTER — Emergency Department (HOSPITAL_COMMUNITY)
Admission: EM | Admit: 2012-01-30 | Discharge: 2012-01-30 | Disposition: A | Discharge status: Home / Self Care | Payer: Worker's Compensation | Attending: Emergency Medicine | Admitting: Emergency Medicine

## 2012-01-30 ENCOUNTER — Encounter (HOSPITAL_COMMUNITY): Payer: Self-pay | Admitting: *Deleted

## 2012-01-30 ENCOUNTER — Emergency Department (HOSPITAL_COMMUNITY): Payer: Worker's Compensation

## 2012-01-30 DIAGNOSIS — S46219A Strain of muscle, fascia and tendon of other parts of biceps, unspecified arm, initial encounter: Secondary | ICD-10-CM

## 2012-01-30 DIAGNOSIS — X500XXA Overexertion from strenuous movement or load, initial encounter: Secondary | ICD-10-CM | POA: Insufficient documentation

## 2012-01-30 DIAGNOSIS — Y9289 Other specified places as the place of occurrence of the external cause: Secondary | ICD-10-CM | POA: Insufficient documentation

## 2012-01-30 DIAGNOSIS — S43499A Other sprain of unspecified shoulder joint, initial encounter: Secondary | ICD-10-CM | POA: Insufficient documentation

## 2012-01-30 DIAGNOSIS — I10 Essential (primary) hypertension: Secondary | ICD-10-CM | POA: Insufficient documentation

## 2012-01-30 NOTE — ED Notes (Signed)
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

## 2012-01-30 NOTE — ED Provider Notes (Signed)
History   This chart was scribed for Donnetta Hutching, MD scribed by Magnus Sinning. The patient was seen in room [facility]03/[facility] at 17:45   CSN: [id number]  Arrival date & time 01/30/12  1639  Chief Complaint  Patient presents with  . Arm Injury    (Consider location/radiation/quality/duration/timing/severity/associated sxs/prior treatment) HPI Eddie Salazar is a 51 y.o. male who presents to the Emergency Department complaining of constant moderate left arm pain, as a result of an injury that occurred two hours ago. 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 He says he reached to catch the casket, which cause him to pull his arm. He says the pain has since improved.  Past Medical History  Diagnosis Date  . Hypertension     Past Surgical History  Procedure Date  . Hernia repair   . Colonoscopy 01/13/2012    Procedure: COLONOSCOPY;  Surgeon: Malissa Hippo, MD;  Location: [facility];  Service: Endoscopy;  Laterality: N/A;  830  . Neck surgery   . Rotator cuff repair   . Foot surgery     History reviewed. No pertinent family history.  History  Substance Use Topics  . Smoking status: Never Smoker   . Smokeless tobacco: Never Used  . Alcohol Use: No      Review of Systems 10 Systems reviewed and are negative for acute change except as noted in the HPI. Allergies  Review of patient's allergies indicates no known allergies.  Home Medications   Current Outpatient Rx  Name Route Sig Dispense Refill  . LISINOPRIL 10 MG PO TABS Oral Take 10 mg by mouth daily.      BP 139/85  Pulse 90  Temp 98.5 F (36.9 C) (Oral)  Resp 20  Ht 5\' 10"  (1.778 m)  Wt 175 lb (79.379 kg)  BMI 25.11 kg/m2  SpO2 100%  Physical Exam  Nursing note and vitals reviewed. Constitutional: He is oriented to person, place, and time. He appears well-developed and well-nourished.  HENT:  Head:  Normocephalic and atraumatic.  Eyes: Conjunctivae normal and EOM are normal. Pupils are equal, round, and reactive to light.  Neck: Normal range of motion. Neck supple.  Cardiovascular: Normal rate, regular rhythm and normal heart sounds.   Pulmonary/Chest: Effort normal and breath sounds normal.  Abdominal: Soft. Bowel sounds are normal.  Musculoskeletal: He exhibits tenderness.       Tender in distal biceps in proximal anterior forearm muscle. Can flex and extend with discomfort. Internally and externally rotates  Neurological: He is alert and oriented to person, place, and time.  Skin: Skin is warm and dry.  Psychiatric: He has a normal mood and affect.    ED Course  Procedures (including critical care time) DIAGNOSTIC STUDIES: Oxygen Saturation is 100% on room air, normal by my interpretation.    COORDINATION OF CARE: 15:48: Physical exam performed. Informed of possible muscle tear. Recommended ice application and arm stabilized in sling. Pt informed to follow up with orthopedic specialist, Dr. Gurney Maxin. Patient declines rx for pain medication, stating he will take OTC.   Labs Reviewed - No data to display No results found.   No diagnosis found.    MDM  Patient is tender in his distal biceps and tendinous insertion.  Also tender in proximal forearm flexors.  I suspect he has torn muscle tissue and tendon tissue.  Sling, ice, referral to orthopedics. I  personally performed the services described in this documentation, which was scribed in my presence. The recorded information has been reviewed and considered.         Donnetta Hutching, MD 01/30/12 Rickey Primus

## 2012-02-01 ENCOUNTER — Encounter: Payer: Self-pay | Admitting: Orthopedic Surgery

## 2012-02-01 ENCOUNTER — Ambulatory Visit (INDEPENDENT_AMBULATORY_CARE_PROVIDER_SITE_OTHER): Payer: Worker's Compensation | Admitting: Orthopedic Surgery

## 2012-02-01 VITALS — BP 150/98 | Ht 70.0 in | Wt 175.0 lb

## 2012-02-01 DIAGNOSIS — S43499A Other sprain of unspecified shoulder joint, initial encounter: Secondary | ICD-10-CM

## 2012-02-01 DIAGNOSIS — S46219A Strain of muscle, fascia and tendon of other parts of biceps, unspecified arm, initial encounter: Secondary | ICD-10-CM | POA: Insufficient documentation

## 2012-02-01 NOTE — Patient Instructions (Addendum)
Do not lift with left arm   MRI pending   Surgery for Biceps Tendon Disruption (Distal) with Rehab Any patient with a complete rupture of their distal biceps tendon is a candidate for surgery to repair the injury. A complete biceps rupture will result in approximately a 30% decrease in elbow bending strength and a 40% decrease in one's ability to rotate the palm upward (supinate). The timing of surgery is very important for one's chances of a complete recovery. Surgery should be performed as soon as possible after injury (usually within 3 weeks). The reason for this is that a delay may result in an inability to reattach the tendon to the bone. The goal of biceps tendon rupture surgery is to regain full function of the biceps muscle. REASONS NOT TO OPERATE  Infection near the site of injury.   No functional impairment (sedentary persons).   Inability or unwillingness to complete a postoperative rehabilitation program.  RISKS AND COMPLICATIONS  Wound infection.   Bleeding or injury to blood vessels.   Nerve damage resulting in numbness, weakness, or paralysis of the elbow, forearm, and hand.   Re-rupture of the tendon from the bone.   Elbow or wrist and forearm stiffness and loss of some or all motion at the elbow, wrist, or forearm.  TECHNIQUE There are multiple surgical techniques that exist for this procedure. A common technique involves two incisions; one in the front to find the tendon and the other on the outer elbow to make a hole (trough) in the bone and sew the tendon into the bone using heavy thread (suture). Another technique requires only one incision in the front of the elbow. This technique reattaches the tendon in the same location it ruptured from by the use of screws or devices similar to toggle bolts (bone anchors). These bone anchors are used to sew the tendon onto the bone with heavy sutures.   HOME CARE INSTRUCTIONS    Postoperative management surgery varies depending  on the surgical technique and the therapist.   The elbow is typically immobilized in a splint, cast, or brace for 3 to 9 weeks.   As often as possible, keep the arm and elbow elevated above heart level for the first 1 to 2 weeks after surgery.   You will be given pain medications by your caregiver.   A sling may be given for comfort after surgery.   Postoperative rehabilitation and exercises are very important to regain motion and then strength.  RETURN TO SPORTS  The time required before returning to sports depends on the type of sport, the position played, and the quality of ligaments at the time of repair. Your therapist will make this decision.   A minimum of 3 to 6 months is necessary after surgery before returning to sports.   Full elbow and wrist motion and strength are necessary before returning to sports.  SEEK MEDICAL CARE IF:    You experience pain, numbness, or coldness in the hand.   Blue, gray, or dark color appears in the fingernails.   Any of the following occur after surgery:   Increased pain, swelling, redness, drainage, or bleeding in the surgical area.   Signs of infection (headache, muscle aches, dizziness, or a general ill feeling with fever).   New, unexplained symptoms develop (drugs used in treatment may produce side effects).  Do not eat or drink anything before surgery. Solid food makes general anesthesia more hazardous.

## 2012-02-01 NOTE — Progress Notes (Signed)
Patient ID: Eddie Salazar, male   DOB: 1960/05/28, 51 y.o.   MRN: [id number] Chief Complaint  Patient presents with  . Elbow Injury    Left elbow pain, DOI 01-30-12.     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 He was evaluated in the emergency room diagnosed with a biceps tendon tear. He complains of sharp one out of 10 pain swelling and weakness in the left upper extremity  He denies any symptoms on her review of systems. Medical history is recorded.  Vital signs are stable BP 150/98  Ht 5\' 10"  (1.778 m)  Wt 175 lb (79.379 kg)  BMI 25.11 kg/m2  Vital signs are stable as recorded  General appearance is normal  The patient is alert and oriented x3  The patient's mood and affect are normal  Gait assessment: Normal The cardiovascular exam reveals normal pulses and temperature without edema swelling.  The lymphatic system is negative for palpable lymph nodes  The sensory exam is normal.  There are no pathologic reflexes.  Balance is normal.   Exam of the left elbow Inspection there is swelling and bruising in the left forearm and also in the distal portion of the biceps, there's tenderness and an abnormal hook test for integrity of the biceps tendon Range of motion full range of motion Stability normal stability in the left elbow Strength flexion weakness and supination weakness Skin intact except for bruising  Right elbow inspection is normal. The hook test is negative for biceps tendon tear range of motion strength stability normal, skin normal  X-ray negative  Left biceps tendon rupture  Left elbow MRI  If torn the patient has been advised that it is advised that someone his age have surgery or else they will have weakness in flexion with heavy objects as well as  supination  The patient is limited in terms of his lifting at work

## 2012-02-02 ENCOUNTER — Telehealth: Payer: Self-pay | Admitting: Orthopedic Surgery

## 2012-02-02 NOTE — Telephone Encounter (Signed)
Contact to Workers comp carrier, [employer], ph# [phone], X [phone].  Spoke with Christy B, requested pre-auth for MRI, CPT [id number].  Relayed, per Dr. Romeo Apple, the urgency of the MRI request, as this problem needs to be fixed within 2 weeks.  Faxed notes, order to her Attention to Fax [phone].

## 2012-02-03 ENCOUNTER — Other Ambulatory Visit: Payer: Self-pay | Admitting: Radiology

## 2012-02-03 DIAGNOSIS — S46219A Strain of muscle, fascia and tendon of other parts of biceps, unspecified arm, initial encounter: Secondary | ICD-10-CM

## 2012-02-03 NOTE — Telephone Encounter (Signed)
Called back 02/03/12 to insurer to follow up; also checked w/patient to inquire if he has heard directly - he states he received a packet from Workers Comp, has completed it and has mailed it as of last evening 02/02/12. I left voice mail message for Workers comp [employer] B at ph# noted.

## 2012-02-04 ENCOUNTER — Telehealth: Payer: Self-pay | Admitting: Orthopedic Surgery

## 2012-02-04 NOTE — Telephone Encounter (Signed)
Authorization received, verbal on 02/03/12 and written authorization 02/04/12, case # [id number], per Worker's Comp case manager, Nicole Cella, ph# [phone], fax [phone].  Their 3rd party contact, [employer], has copy of order, and has scheduled MRI at [facility], ph # [phone].  Copy of order faxed directly to [facility], [phone] +to their schedule center [phone]  - Patient is aware of MRI appointment:  02/05/12, 7:30PM, and has confirmed.  Follow up here for results.

## 2012-02-04 NOTE — Telephone Encounter (Signed)
Patient called back, confirmed MRI appointment as follows:  (Note copied from previous telephone documentation):   MRI approved/authorized:     case # [id number], per Worker's Comp case manager, Nicole Cella, ph# [phone], fax [phone], for [facility].  Their 3rd party contact, [employer], has copy of order, and has scheduled the MRI at [facility], ph # [phone].  Copy of order faxed directly to [facility] also, [phone] + to their schedule center at fax #[phone].  Requested copy of report to be faxed +  requested copy of film to be given to patient.  - Patient is aware of MRI appointment:  Friday, 02/05/12, 7:30PM. Follow up here for results.

## 2012-02-08 ENCOUNTER — Ambulatory Visit (INDEPENDENT_AMBULATORY_CARE_PROVIDER_SITE_OTHER): Payer: Worker's Compensation | Admitting: Orthopedic Surgery

## 2012-02-08 ENCOUNTER — Telehealth: Payer: Self-pay | Admitting: Orthopedic Surgery

## 2012-02-08 ENCOUNTER — Encounter: Payer: Self-pay | Admitting: Orthopedic Surgery

## 2012-02-08 VITALS — BP 120/84 | Ht 70.0 in | Wt 175.0 lb

## 2012-02-08 DIAGNOSIS — S46219A Strain of muscle, fascia and tendon of other parts of biceps, unspecified arm, initial encounter: Secondary | ICD-10-CM

## 2012-02-08 DIAGNOSIS — S46819A Strain of other muscles, fascia and tendons at shoulder and upper arm level, unspecified arm, initial encounter: Secondary | ICD-10-CM

## 2012-02-08 DIAGNOSIS — S43499A Other sprain of unspecified shoulder joint, initial encounter: Secondary | ICD-10-CM

## 2012-02-08 NOTE — Progress Notes (Signed)
Patient ID: Eddie Salazar, male   DOB: March 11, 1961, 51 y.o.   MRN: [id number] Chief Complaint  Patient presents with  . Results    left biceps tendon MRI results    + study   MRI shows torn biceps tendon. There is also reported evidence of extensor common tendon tear, but this is fluid and no evidence of clinical injury. At this level with intact. Wrist extension. No tenderness over the lateral epicondyle.  Discussed the risks and benefits of surgery and patient education was given to the patient with details of rehabilitation and tendon injuries of this type.  Recommend surgery to repair the tendon

## 2012-02-08 NOTE — Telephone Encounter (Signed)
Contacted Workers comp Sports coach, Nicole Cella, ph [phone], regarding today's (02/08/12) office visit; left voice message.  Faxing of notes to follow upon completion to # [phone].  Requesting authorization for surgery, CPT [phone],  Biceps tendon repair, for anticipated date 02/12/12, as per Dr. Romeo Apple.  Noted urgency of request.

## 2012-02-08 NOTE — Patient Instructions (Addendum)
Surgery for Biceps Tendon Disruption (Distal)  with Rehab  Any patient with a complete rupture of their distal biceps tendon is a candidate for surgery to repair the injury. A complete biceps rupture will result in approximately a 30% decrease in elbow bending strength and a 40% decrease in one's ability to rotate the palm upward (supinate). The timing of surgery is very important for one's chances of a complete recovery. Surgery should be performed as soon as possible after injury (usually within 3 weeks). The reason for this is that a delay may result in an inability to reattach the tendon to the bone. The goal of biceps tendon rupture surgery is to regain full function of the biceps muscle.  REASONS NOT TO OPERATE  · Infection near the site of injury.  · No functional impairment (sedentary persons).  · Inability or unwillingness to complete a postoperative rehabilitation program.  RISKS AND COMPLICATIONS  · Wound infection.  · Bleeding or injury to blood vessels.  · Nerve damage resulting in numbness, weakness, or paralysis of the elbow, forearm, and hand.  · Re-rupture of the tendon from the bone.  · Elbow or wrist and forearm stiffness and loss of some or all motion at the elbow, wrist, or forearm.  TECHNIQUE  There are multiple surgical techniques that exist for this procedure. A common technique involves two incisions; one in the front to find the tendon and the other on the outer elbow to make a hole (trough) in the bone and sew the tendon into the bone using heavy thread (suture). Another technique requires only one incision in the front of the elbow. This technique reattaches the tendon in the same location it ruptured from by the use of screws or devices similar to toggle bolts (bone anchors). These bone anchors are used to sew the tendon onto the bone with heavy sutures.   HOME CARE INSTRUCTIONS   · Postoperative management surgery varies depending on the surgical technique and the therapist.  · The  elbow is typically immobilized in a splint, cast, or brace for 3 to 9 weeks.  · As often as possible, keep the arm and elbow elevated above heart level for the first 1 to 2 weeks after surgery.  · You will be given pain medications by your caregiver.  · A sling may be given for comfort after surgery.  · Postoperative rehabilitation and exercises are very important to regain motion and then strength.  RETURN TO SPORTS  · The time required before returning to sports depends on the type of sport, the position played, and the quality of ligaments at the time of repair. Your therapist will make this decision.  · A minimum of 3 to 6 months is necessary after surgery before returning to sports.  · Full elbow and wrist motion and strength are necessary before returning to sports.  SEEK MEDICAL CARE IF:   · You experience pain, numbness, or coldness in the hand.  · Blue, gray, or dark color appears in the fingernails.  · Any of the following occur after surgery:  ¨ Increased pain, swelling, redness, drainage, or bleeding in the surgical area.  ¨ Signs of infection (headache, muscle aches, dizziness, or a general ill feeling with fever).  · New, unexplained symptoms develop (drugs used in treatment may produce side effects).  Do not eat or drink anything before surgery. Solid food makes general anesthesia more hazardous.  Surgery for Biceps Tendon Disruption (Distal) with Rehab Any patient with a complete rupture of their distal biceps tendon is a candidate for surgery to repair the injury. A complete biceps rupture will result in approximately a 30% decrease in elbow bending strength and a 40% decrease in one's ability to rotate the palm upward (supinate). The timing of surgery is very important for one's chances of a complete recovery. Surgery should be performed as soon as possible after injury (usually within 3 weeks). The reason for this is that a delay may result in an inability to reattach the tendon to the bone. The goal of biceps tendon rupture surgery is to regain full function of the biceps muscle. REASONS NOT TO OPERATE  Infection near the site of injury.   No functional impairment (sedentary persons).   Inability or unwillingness to complete a postoperative rehabilitation program.  RISKS AND COMPLICATIONS  Wound infection.   Bleeding or injury to blood vessels.   Nerve damage resulting in numbness, weakness, or paralysis of the elbow, forearm, and hand.   Re-rupture of the tendon from the bone.   Elbow or wrist and forearm stiffness and loss of some or all motion at the elbow, wrist, or forearm.  TECHNIQUE There are multiple surgical techniques that exist for this procedure. A common technique involves two incisions; one in the front to find the tendon and the other on the outer elbow to make a hole (trough) in the bone and sew the tendon into the bone using heavy thread (suture). Another technique requires only one incision in the front of the elbow. This technique reattaches the tendon in the same location it ruptured from by the use of screws or devices similar to toggle bolts (bone anchors). These bone anchors are used to sew the tendon onto the bone with heavy sutures.   HOME CARE INSTRUCTIONS    Postoperative management surgery varies depending on the surgical technique and the therapist.    The elbow is typically immobilized in a splint, cast, or brace for 3 to 9 weeks.   As often as possible, keep the arm and elbow elevated above heart level for the first 1 to 2 weeks after surgery.   You will be given pain medications by your caregiver.   A sling may be given for comfort after surgery.   Postoperative rehabilitation and exercises are very important to regain motion and then strength.  RETURN TO SPORTS  The time required before returning to sports depends on the type of sport, the position played, and the quality of ligaments at the time of repair. Your therapist will make this decision.   A minimum of 3 to 6 months is necessary after surgery before returning to sports.   Full elbow and wrist motion and strength are necessary before returning to sports.  SEEK MEDICAL CARE IF:    You experience pain, numbness, or coldness in the hand.   Blue, gray, or dark color appears in the fingernails.   Any of the following occur after surgery:   Increased pain, swelling, redness, drainage, or bleeding in the surgical area.   Signs of infection (headache, muscle aches, dizziness, or a general ill feeling with fever).   New, unexplained symptoms develop (drugs used in treatment may produce side effects).  Do not eat or drink anything before surgery. Solid food makes general anesthesia more hazardous.

## 2012-02-09 ENCOUNTER — Encounter (HOSPITAL_COMMUNITY): Payer: Self-pay | Admitting: Pharmacy Technician

## 2012-02-09 NOTE — Telephone Encounter (Signed)
02/09/12 Received approval for the surgery, per call received from nurse case manager Nicole Cella and per verbal and written authorization received via email from adjuster Catha Gosselin, [employer] worker's [employer] Authorization # is same as Claim# [id number].

## 2012-02-10 NOTE — Patient Instructions (Addendum)
21 Eddie Salazar  02/10/2012   Your procedure is scheduled on:  02/12/12  Report to [facility] at [place] [facility].  Call this number if you have problems the morning of surgery: [phone]   Remember:   Do not eat food:After Midnight.  May have clear liquids:until Midnight .  Clear liquids include soda, tea, black coffee, apple or grape juice, broth.  Take these medicines the morning of surgery with A SIP OF WATER: lisinopril   Do not wear jewelry, make-up or nail polish.  Do not wear lotions, powders, or perfumes. You may wear deodorant.  Do not shave 48 hours prior to surgery. Men may shave face and neck.  Do not bring valuables to the hospital.  Contacts, dentures or bridgework may not be worn into surgery.  Leave suitcase in the car. After surgery it may be brought to your room.  For patients admitted to the hospital, checkout time is 11:00 AM the day of discharge.   Patients discharged the day of surgery will not be allowed to drive home.  Name and phone number of your driver: family  Special Instructions: Shower using CHG 2 nights before surgery and the night before surgery.  If you shower the day of surgery use CHG.  Use special wash - you have one bottle of CHG for all showers.  You should use approximately 1/3 of the bottle for each shower.   Please read over the following fact sheets that you were given: Pain Booklet, MRSA Information, Surgical Site Infection Prevention, Anesthesia Post-op Instructions and Care and Recovery After Surgery   PATIENT INSTRUCTIONS POST-ANESTHESIA  IMMEDIATELY FOLLOWING SURGERY:  Do not drive or operate machinery for the first twenty four hours after surgery.  Do not make any important decisions for twenty four hours after surgery or while taking narcotic pain medications or sedatives.  If you develop intractable nausea and vomiting or a severe headache please notify your doctor immediately.  FOLLOW-UP:  Please make an appointment with your surgeon as  instructed. You do not need to follow up with anesthesia unless specifically instructed to do so.  WOUND CARE INSTRUCTIONS (if applicable):  Keep a dry clean dressing on the anesthesia/puncture wound site if there is drainage.  Once the wound has quit draining you may leave it open to air.  Generally you should leave the bandage intact for twenty four hours unless there is drainage.  If the epidural site drains for more than 36-48 hours please call the anesthesia department.  QUESTIONS?:  Please feel free to call your physician or the hospital operator if you have any questions, and they will be happy to assist you.      Surgery for Biceps Tendon Disruption (Distal) with Rehab Any patient with a complete rupture of their distal biceps tendon is a candidate for surgery to repair the injury. A complete biceps rupture will result in approximately a 30% decrease in elbow bending strength and a 40% decrease in one's ability to rotate the palm upward (supinate). The timing of surgery is very important for one's chances of a complete recovery. Surgery should be performed as soon as possible after injury (usually within 3 weeks). The reason for this is that a delay may result in an inability to reattach the tendon to the bone. The goal of biceps tendon rupture surgery is to regain full function of the biceps muscle. REASONS NOT TO OPERATE  Infection near the site of injury.  No functional impairment (sedentary persons).  Inability  or unwillingness to complete a postoperative rehabilitation program. RISKS AND COMPLICATIONS  Wound infection.  Bleeding or injury to blood vessels.  Nerve damage resulting in numbness, weakness, or paralysis of the elbow, forearm, and hand.  Re-rupture of the tendon from the bone.  Elbow or wrist and forearm stiffness and loss of some or all motion at the elbow, wrist, or forearm. TECHNIQUE There are multiple surgical techniques that exist for this procedure. A common  technique involves two incisions; one in the front to find the tendon and the other on the outer elbow to make a hole (trough) in the bone and sew the tendon into the bone using heavy thread (suture). Another technique requires only one incision in the front of the elbow. This technique reattaches the tendon in the same location it ruptured from by the use of screws or devices similar to toggle bolts (bone anchors). These bone anchors are used to sew the tendon onto the bone with heavy sutures.  HOME CARE INSTRUCTIONS   Postoperative management surgery varies depending on the surgical technique and the therapist.  The elbow is typically immobilized in a splint, cast, or brace for 3 to 9 weeks.  As often as possible, keep the arm and elbow elevated above heart level for the first 1 to 2 weeks after surgery.  You will be given pain medications by your caregiver.  A sling may be given for comfort after surgery.  Postoperative rehabilitation and exercises are very important to regain motion and then strength. RETURN TO SPORTS  The time required before returning to sports depends on the type of sport, the position played, and the quality of ligaments at the time of repair. Your therapist will make this decision.  A minimum of 3 to 6 months is necessary after surgery before returning to sports.  Full elbow and wrist motion and strength are necessary before returning to sports. SEEK MEDICAL CARE IF:   You experience pain, numbness, or coldness in the hand.  Blue, gray, or dark color appears in the fingernails.  Any of the following occur after surgery:  Increased pain, swelling, redness, drainage, or bleeding in the surgical area.  Signs of infection (headache, muscle aches, dizziness, or a general ill feeling with fever).  New, unexplained symptoms develop (drugs used in treatment may produce side effects). Do not eat or drink anything before surgery. Solid food makes general anesthesia  more hazardous.  EXERCISES RANGE OF MOTION (ROM) AND STRETCHING EXERCISES - Biceps Tendon Disruption (Distal), Surgery for Once your physician, physical therapist, or athletic trainer has permitted you to come out of your brace or splint, you may begin to restore your elbow motion by using these exercises. Beginning these before your provider's approval may result in delayed healing. While completing these exercises, remember:   Restoring tissue flexibility helps normal motion to return to the joints. This allows healthier, less painful movement and activity.  An effective stretch should be held for at least 30 seconds.  A stretch should never be painful. You should only feel a gentle lengthening or release in the stretched tissue. RANGE OF MOTION  Extension  Hold your right / left arm at your side and straighten your elbow as far as you can using your right / left arm muscles.  Straighten the right / left elbow farther by gently pushing down on your forearm until you feel a gentle stretch on the inside of your elbow. Hold this position for __________ seconds.  Slowly return to the  starting position. Repeat __________ times. Complete this exercise __________ times per day.  RANGE OF MOTION  Flexion  Hold your right / left arm at your side and bend your elbow as far as you can using your right / left arm muscles.  Bend the right / left elbow farther by gently pushing up on your forearm until you feel a gentle stretch on the outside of your elbow. Hold this position for __________ seconds.  Slowly return to the starting position. Repeat __________ times. Complete this exercise __________ times per day.  RANGE OF MOTION  Supination, Active   Stand or sit with your elbows at your side. Bend your right / left elbow to 90 degrees.  Turn your palm upward until you feel a gentle stretch on the inside of your forearm.  Hold this position for __________ seconds. Slowly release and return to the  starting position. Repeat __________ times. Complete this stretch __________ times per day.  RANGE OF MOTION  Pronation, Active   Stand or sit with your elbows at your side. Bend your right / left elbow to 90 degrees.  Turn your palm downward until you feel a gentle stretch on the top of your forearm.  Hold this position for __________ seconds. Slowly release and return to the starting position. Repeat __________ times. Complete this stretch __________ times per day.  STRENGTHENING EXERCISES - Biceps Tendon Disruption (Distal), Surgery For Once your physician, physical therapist, or athletic trainer has permitted you to come out of your brace or splint, you may begin to restore your elbow motion by using these exercises. You will likely begin these exercises no earlier than 8 to 12 weeks after your surgery. Beginning these before your provider's approval may result in delayed healing. While completing these exercises, remember:   Muscles can gain both the endurance and the strength needed for everyday activities through controlled exercises.  Complete these exercises as instructed by your physician, physical therapist or athletic trainer. Progress the resistance and repetitions only as guided.  You may experience muscle soreness or fatigue, but the pain or discomfort you are trying to eliminate should never worsen during these exercises. If this pain does worsen, stop and make certain you are following the directions exactly. If the pain is still present after adjustments, discontinue the exercise until you can discuss the trouble with your clinician. STRENGTH - Elbow Flexors, Isometric   Stand or sit upright on a firm surface. Place your right / left arm so that your hand is palm-up and at the height of your waist.  Place your opposite hand on top of your forearm. Gently push down as your right / left arm resists. Push as hard as you can with both arms without causing any pain or movement at  your right / left elbow. Hold this stationary position for __________ seconds.  Gradually release the tension in both arms. Allow your muscles to relax completely before repeating. Repeat __________ times. Complete this exercise __________ times per day. STRENGTH - Elbow Extensors, Isometric   Stand or sit upright on a firm surface. Place your right / left arm so that your palm faces your abdomen and it is at the height of your waist.  Place your opposite hand on the underside of your forearm. Gently push up as your right / left arm resists. Push as hard as you can with both arms without causing any pain or movement at your right / left elbow. Hold this stationary position for __________ seconds.  Gradually release the tension in both arms. Allow your muscles to relax completely before repeating. Repeat __________ times. Complete this exercise __________ times per day. STRENGTH  Elbow Flexors, Supinated  With good posture, stand or sit on a firm chair without armrests. Allow your right / left arm to rest at your side with your palm facing forward.  Holding a __________weight or gripping a rubber exercise band/tubing, bring your hand toward your shoulder.  Allow your muscles to control the resistance as your hand returns to your side. Repeat __________ times. Complete this exercise __________ times per day.  STRENGTH  Elbow Flexors, Neutral  With good posture, stand or sit on a firm chair without armrests. Allow your right / left arm to rest at your side with your thumb facing forward.  Holding a __________ weight or gripping a rubber exercise band/tubing, bring your hand toward your shoulder.  Allow your muscles to control the resistance as your hand returns to your side. Repeat __________ times. Complete this exercise __________ times per day.  STRENGTH  Elbow Extensors  Lie on your back. Extend your right / left elbow into the air, pointing it toward the ceiling. Brace your arm with  your opposite hand.*  Holding a __________ weight in your hand, slowly straighten your right / left elbow.  Allow your muscles to control the weight as your hand returns to its starting position. Repeat __________ times. Complete this exercise __________ times per day. *You may also stand with your elbow overhead and pointed toward the ceiling and supported by your opposite hand. STRENGTH - Elbow Extensors, Dynamic  With good posture, stand or sit on a firm chair without armrests. Keeping your upper arms at your side, bring both hands up to your right / left shoulder while gripping a rubber exercise band/tubing. Your right / left hand should be just below the other hand.  Straighten your right / left elbow. Hold for __________ seconds.  Allow your muscles to control the rubber exercise band/tubing as your hand returns to your shoulder. Repeat __________ times. Complete this exercise __________ times per day. STRENGTH  Forearm Supinators   Sit with your right / left forearm supported on a table, keeping your elbow below shoulder height. Rest your hand over the edge, palm down.  Gently grip a hammer or a soup ladle.  Without moving your elbow, slowly turn your palm and hand upward to a "thumbs-up" position.  Hold this position for __________ seconds. Slowly return to the starting position. Repeat __________ times. Complete this exercise __________ times per day.  STRENGTH  Forearm Pronators   Sit with your right / left forearm supported on a table, keeping your elbow below shoulder height. Rest your hand over the edge, palm up.  Gently grip a hammer or a soup ladle.  Without moving your elbow, slowly turn your palm and hand upward to a "thumbs-up" position.  Hold this position for __________ seconds. Slowly return to the starting position. Repeat __________ times. Complete this exercise __________ times per day.  Document Released: 04/06/2005 Document Revised: 06/29/2011 Document  Reviewed: 07/19/2008 [facility] Patient Information 2013 [place], [place].

## 2012-02-11 ENCOUNTER — Encounter (HOSPITAL_COMMUNITY)
Admission: RE | Admit: 2012-02-11 | Discharge: 2012-02-11 | Disposition: A | Discharge status: Home / Self Care | Payer: Worker's Compensation | Source: Ambulatory Visit | Attending: Orthopedic Surgery | Admitting: Orthopedic Surgery

## 2012-02-11 ENCOUNTER — Encounter (HOSPITAL_COMMUNITY): Payer: Self-pay

## 2012-02-11 ENCOUNTER — Other Ambulatory Visit: Payer: Self-pay

## 2012-02-11 LAB — BASIC METABOLIC PANEL
BUN: 14 mg/dL (ref 6–23)
CO2: 27 mEq/L (ref 19–32)
Calcium: 9.8 mg/dL (ref 8.4–10.5)
Chloride: 101 mEq/L (ref 96–112)
Creatinine, Ser: 0.86 mg/dL (ref 0.50–1.35)

## 2012-02-11 LAB — HEMOGLOBIN AND HEMATOCRIT, BLOOD
HCT: 46.5 % (ref 39.0–52.0)
Hemoglobin: 16 g/dL (ref 13.0–17.0)

## 2012-02-11 LAB — SURGICAL PCR SCREEN: MRSA, PCR: NEGATIVE

## 2012-02-11 NOTE — H&P (Signed)
  Patient ID: Eddie Salazar, male DOB: 1961-03-11, 51 y.o. MRN: [id number]  Chief Complaint   Patient presents with   .  Elbow Injury     Left elbow pain, DOI 01-30-12.   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 He was evaluated in the emergency room diagnosed with a biceps tendon tear. He complains of sharp one out of 10 pain swelling and weakness in the left upper extremity  He denies any symptoms on her review of systems. Medical history is recorded.   Past Medical History  Diagnosis Date  . Hypertension    Past Surgical History  Procedure Date  . Hernia repair   . Colonoscopy 01/13/2012    Procedure: COLONOSCOPY;  Surgeon: Malissa Hippo, MD;  Location: [facility];  Service: Endoscopy;  Laterality: N/A;  830  . Neck surgery   . Rotator cuff repair   . Foot surgery    History  Substance Use Topics  . Smoking status: Never Smoker   . Smokeless tobacco: Never Used  . Alcohol Use: No   family history includes Cancer in an unspecified family member and Dementia in his mother.  Vital signs are stable  BP 150/98  Ht 5\' 10"  (1.778 m)  Wt 175 lb (79.379 kg)  BMI 25.11 kg/m2  Vital signs are stable as recorded  General appearance is normal  The patient is alert and oriented x3  The patient's mood and affect are normal  Gait assessment: Normal  The cardiovascular exam reveals normal pulses and temperature without edema swelling.  The lymphatic system is negative for palpable lymph nodes  The sensory exam is normal.  There are no pathologic reflexes.  Balance is normal.  Exam of the left elbow  Inspection there is swelling and bruising in the left forearm and also in the distal portion of the biceps, there's tenderness and an abnormal hook test for integrity of the biceps tendon  Range  of motion full range of motion  Stability normal stability in the left elbow  Strength flexion weakness and supination weakness  Skin intact except for bruising  Right elbow inspection is normal. The hook test is negative for biceps tendon tear range of motion strength stability normal, skin normal   X-ray negative   Left biceps tendon rupture   Left elbow MRI INDICATES BICEPS TENDON RUPTURE   Plan biceps tendon repair left arm

## 2012-02-12 ENCOUNTER — Ambulatory Visit (HOSPITAL_COMMUNITY): Payer: Worker's Compensation | Admitting: Anesthesiology

## 2012-02-12 ENCOUNTER — Encounter (HOSPITAL_COMMUNITY): Payer: Self-pay

## 2012-02-12 ENCOUNTER — Encounter (HOSPITAL_COMMUNITY): Payer: Self-pay | Admitting: Anesthesiology

## 2012-02-12 ENCOUNTER — Ambulatory Visit (HOSPITAL_COMMUNITY)
Admission: RE | Admit: 2012-02-12 | Discharge: 2012-02-12 | Disposition: A | Discharge status: Home / Self Care | Payer: Worker's Compensation | Source: Ambulatory Visit | Attending: Orthopedic Surgery | Admitting: Orthopedic Surgery

## 2012-02-12 ENCOUNTER — Encounter (HOSPITAL_COMMUNITY)
Admission: RE | Disposition: A | Discharge status: Home / Self Care | Payer: Self-pay | Source: Ambulatory Visit | Attending: Orthopedic Surgery

## 2012-02-12 DIAGNOSIS — Z01812 Encounter for preprocedural laboratory examination: Secondary | ICD-10-CM | POA: Insufficient documentation

## 2012-02-12 DIAGNOSIS — Z01810 Encounter for preprocedural cardiovascular examination: Secondary | ICD-10-CM | POA: Insufficient documentation

## 2012-02-12 DIAGNOSIS — I10 Essential (primary) hypertension: Secondary | ICD-10-CM | POA: Insufficient documentation

## 2012-02-12 DIAGNOSIS — Y999 Unspecified external cause status: Secondary | ICD-10-CM | POA: Insufficient documentation

## 2012-02-12 DIAGNOSIS — X500XXA Overexertion from strenuous movement or load, initial encounter: Secondary | ICD-10-CM | POA: Insufficient documentation

## 2012-02-12 DIAGNOSIS — S53499A Other sprain of unspecified elbow, initial encounter: Secondary | ICD-10-CM | POA: Insufficient documentation

## 2012-02-12 DIAGNOSIS — Y9269 Other specified industrial and construction area as the place of occurrence of the external cause: Secondary | ICD-10-CM | POA: Insufficient documentation

## 2012-02-12 DIAGNOSIS — S43499A Other sprain of unspecified shoulder joint, initial encounter: Secondary | ICD-10-CM

## 2012-02-12 DIAGNOSIS — S46219A Strain of muscle, fascia and tendon of other parts of biceps, unspecified arm, initial encounter: Secondary | ICD-10-CM

## 2012-02-12 HISTORY — PX: DISTAL BICEPS TENDON REPAIR: SHX1461

## 2012-02-12 SURGERY — REPAIR, TENDON, BICEPS, DISTAL
Anesthesia: General | Site: Arm Lower | Laterality: Left | Wound class: Clean

## 2012-02-12 MED ORDER — LIDOCAINE HCL (PF) 1 % IJ SOLN
INTRAMUSCULAR | Status: AC
  Filled 2012-02-12: qty 5

## 2012-02-12 MED ORDER — ONDANSETRON HCL 4 MG/2ML IJ SOLN: 4.0000 mg | Freq: Once | INTRAMUSCULAR | Status: DC | PRN

## 2012-02-12 MED ORDER — KETOROLAC TROMETHAMINE 30 MG/ML IJ SOLN
INTRAMUSCULAR | Status: AC
  Filled 2012-02-12: qty 1

## 2012-02-12 MED ORDER — ONDANSETRON HCL 4 MG/2ML IJ SOLN
INTRAMUSCULAR | Status: AC
  Filled 2012-02-12: qty 2

## 2012-02-12 MED ORDER — PROMETHAZINE HCL 12.5 MG PO TABS: 12.5000 mg | ORAL_TABLET | Freq: Four times a day (QID) | ORAL | Status: DC | PRN

## 2012-02-12 MED ORDER — PROPOFOL 10 MG/ML IV BOLUS
INTRAVENOUS | Status: DC | PRN
  Administered 2012-02-12: 150 mg via INTRAVENOUS

## 2012-02-12 MED ORDER — ONDANSETRON HCL 4 MG/2ML IJ SOLN
4.0000 mg | Freq: Once | INTRAMUSCULAR | Status: AC
  Administered 2012-02-12: 4 mg via INTRAVENOUS

## 2012-02-12 MED ORDER — MIDAZOLAM HCL 2 MG/2ML IJ SOLN
1.0000 mg | INTRAMUSCULAR | Status: DC | PRN
  Administered 2012-02-12: 2 mg via INTRAVENOUS

## 2012-02-12 MED ORDER — ACETAMINOPHEN 10 MG/ML IV SOLN
1000.0000 mg | Freq: Once | INTRAVENOUS | Status: AC
  Administered 2012-02-12: 1000 mg via INTRAVENOUS

## 2012-02-12 MED ORDER — FENTANYL CITRATE 0.05 MG/ML IJ SOLN: 25.0000 ug | INTRAMUSCULAR | Status: DC | PRN

## 2012-02-12 MED ORDER — CEFAZOLIN SODIUM-DEXTROSE 2-3 GM-% IV SOLR: 2.0000 g | INTRAVENOUS | Status: DC

## 2012-02-12 MED ORDER — BUPIVACAINE-EPINEPHRINE PF 0.5-1:200000 % IJ SOLN
INTRAMUSCULAR | Status: DC | PRN
  Administered 2012-02-12: 10 mL

## 2012-02-12 MED ORDER — SODIUM CHLORIDE 0.9 % IR SOLN
Status: DC | PRN
  Administered 2012-02-12: 1000 mL

## 2012-02-12 MED ORDER — CEFAZOLIN SODIUM-DEXTROSE 2-3 GM-% IV SOLR
INTRAVENOUS | Status: AC
  Filled 2012-02-12: qty 50

## 2012-02-12 MED ORDER — PROPOFOL 10 MG/ML IV EMUL
INTRAVENOUS | Status: AC
  Filled 2012-02-12: qty 20

## 2012-02-12 MED ORDER — MIDAZOLAM HCL 2 MG/2ML IJ SOLN
INTRAMUSCULAR | Status: AC
  Filled 2012-02-12: qty 2

## 2012-02-12 MED ORDER — CEFAZOLIN SODIUM-DEXTROSE 2-3 GM-% IV SOLR
INTRAVENOUS | Status: DC | PRN
  Administered 2012-02-12: 2 g via INTRAVENOUS

## 2012-02-12 MED ORDER — LACTATED RINGERS IV SOLN
INTRAVENOUS | Status: DC
  Administered 2012-02-12: 11:00:00 via INTRAVENOUS

## 2012-02-12 MED ORDER — FENTANYL CITRATE 0.05 MG/ML IJ SOLN
INTRAMUSCULAR | Status: AC
  Filled 2012-02-12: qty 2

## 2012-02-12 MED ORDER — LIDOCAINE HCL 1 % IJ SOLN
INTRAMUSCULAR | Status: DC | PRN
  Administered 2012-02-12: 50 mg via INTRADERMAL

## 2012-02-12 MED ORDER — CHLORHEXIDINE GLUCONATE 4 % EX LIQD: 60.0000 mL | Freq: Once | CUTANEOUS | Status: DC

## 2012-02-12 MED ORDER — KETOROLAC TROMETHAMINE 30 MG/ML IJ SOLN
30.0000 mg | Freq: Once | INTRAMUSCULAR | Status: AC
  Administered 2012-02-12: 30 mg via INTRAVENOUS

## 2012-02-12 MED ORDER — HYDROCODONE-ACETAMINOPHEN 7.5-325 MG PO TABS: 1.0000 | ORAL_TABLET | ORAL | Status: DC | PRN

## 2012-02-12 MED ORDER — MIDAZOLAM HCL 5 MG/5ML IJ SOLN
INTRAMUSCULAR | Status: DC | PRN
  Administered 2012-02-12: 2 mg via INTRAVENOUS

## 2012-02-12 MED ORDER — FENTANYL CITRATE 0.05 MG/ML IJ SOLN
INTRAMUSCULAR | Status: DC | PRN
  Administered 2012-02-12: 12.5 ug via INTRAVENOUS
  Administered 2012-02-12: 50 ug via INTRAVENOUS
  Administered 2012-02-12: 25 ug via INTRAVENOUS
  Administered 2012-02-12: 12.5 ug via INTRAVENOUS
  Administered 2012-02-12 (×2): 50 ug via INTRAVENOUS

## 2012-02-12 MED ORDER — BUPIVACAINE-EPINEPHRINE PF 0.5-1:200000 % IJ SOLN
INTRAMUSCULAR | Status: AC
  Filled 2012-02-12: qty 20

## 2012-02-12 MED ORDER — ACETAMINOPHEN 10 MG/ML IV SOLN
INTRAVENOUS | Status: AC
  Filled 2012-02-12: qty 100

## 2012-02-12 SURGICAL SUPPLY — 72 items
ANCHOR BUTTON TIGHTROPE ABS (Orthopedic Implant) ×1 IMPLANT
BANDAGE ELASTIC 4 VELCRO NS (GAUZE/BANDAGES/DRESSINGS) ×1 IMPLANT
BANDAGE ELASTIC 4 VELCRO ST LF (GAUZE/BANDAGES/DRESSINGS) ×2 IMPLANT
BANDAGE ESMARK 4X12 BL STRL LF (DISPOSABLE) ×1 IMPLANT
BANDAGE GAUZE ELAST BULKY 4 IN (GAUZE/BANDAGES/DRESSINGS) ×2 IMPLANT
BIT DRILL 2.0MX128MM (BIT) IMPLANT
BLADE HEX COATED 2.75 (ELECTRODE) ×2 IMPLANT
BLADE OSC/SAGITTAL MD 9X18.5 (BLADE) IMPLANT
BLADE SURG 15 STRL LF DISP TIS (BLADE) ×1 IMPLANT
BLADE SURG 15 STRL SS (BLADE) ×2
BLADE SURG SZ10 CARB STEEL (BLADE) ×2 IMPLANT
BNDG CMPR 12X4 ELC STRL LF (DISPOSABLE) ×1
BNDG COHESIVE 4X5 TAN NS LF (GAUZE/BANDAGES/DRESSINGS) ×2 IMPLANT
BNDG ESMARK 4X12 BLUE STRL LF (DISPOSABLE) ×2
CHLORAPREP W/TINT 26ML (MISCELLANEOUS) ×3 IMPLANT
CLOTH BEACON ORANGE TIMEOUT ST (SAFETY) ×2 IMPLANT
COVER LIGHT HANDLE STERIS (MISCELLANEOUS) ×4 IMPLANT
COVER MAYO STAND XLG (DRAPE) ×2 IMPLANT
COVER PROBE W GEL 5X96 (DRAPES) ×2 IMPLANT
CUFF TOURNIQUET SINGLE 18IN (TOURNIQUET CUFF) ×2 IMPLANT
DRAPE EXTREMITY T 121X128X90 (DRAPE) ×1 IMPLANT
DRAPE ORTHO 2.5IN SPLIT 77X108 (DRAPES) ×2 IMPLANT
DRAPE ORTHO SPLIT 77X108 STRL (DRAPES) ×4
DRAPE PROXIMA HALF (DRAPES) ×2 IMPLANT
DRAPE U-SHAPE 47X51 STRL (DRAPES) ×2 IMPLANT
DRESSING ALLEVYN BORDER 5X5 (GAUZE/BANDAGES/DRESSINGS) ×2 IMPLANT
ELECT REM PT RETURN 9FT ADLT (ELECTROSURGICAL) ×2
ELECTRODE REM PT RTRN 9FT ADLT (ELECTROSURGICAL) ×1 IMPLANT
FIBERLOOP #2 (DISPOSABLE) ×1 IMPLANT
GAUZE SPONGE 4X4 16PLY XRAY LF (GAUZE/BANDAGES/DRESSINGS) ×1 IMPLANT
GLOVE ECLIPSE 7.0 STRL STRAW (GLOVE) ×1 IMPLANT
GLOVE SKINSENSE NS SZ8.0 LF (GLOVE) ×1
GLOVE SKINSENSE STRL SZ8.0 LF (GLOVE) ×1 IMPLANT
GLOVE SS N UNI LF 8.5 STRL (GLOVE) ×2 IMPLANT
GOWN STRL REIN XL XLG (GOWN DISPOSABLE) ×6 IMPLANT
INSERTER BUTTON (SYSTAGENIX WOUND MANAGEMENT) ×1 IMPLANT
INST SET MINOR BONE (KITS) ×2 IMPLANT
KIT BLADEGUARD II DBL (SET/KITS/TRAYS/PACK) ×2 IMPLANT
KIT ROOM TURNOVER APOR (KITS) ×2 IMPLANT
MANIFOLD NEPTUNE II (INSTRUMENTS) ×2 IMPLANT
MARKER SKIN DUAL TIP RULER LAB (MISCELLANEOUS) ×2 IMPLANT
NDL HYPO 21X1.5 SAFETY (NEEDLE) ×1 IMPLANT
NDL MAYO 6 CRC TAPER PT (NEEDLE) IMPLANT
NEEDLE HYPO 21X1.5 SAFETY (NEEDLE) ×2 IMPLANT
NEEDLE MAYO 6 CRC TAPER PT (NEEDLE) ×2 IMPLANT
NS IRRIG 1000ML POUR BTL (IV SOLUTION) ×2 IMPLANT
PACK BASIC III (CUSTOM PROCEDURE TRAY) ×2
PACK SRG BSC III STRL LF ECLPS (CUSTOM PROCEDURE TRAY) ×1 IMPLANT
PAD ARMBOARD 7.5X6 YLW CONV (MISCELLANEOUS) ×2 IMPLANT
PENCIL HANDSWITCHING (ELECTRODE) ×2 IMPLANT
PIN DRILL ACL TIGHTROPE 4MM (PIN) ×1 IMPLANT
SCREW BIO TENODESIS 8.0MM (Screw) ×1 IMPLANT
SET BASIN LINEN APH (SET/KITS/TRAYS/PACK) ×2 IMPLANT
SLING ARM FOAM STRAP LRG (SOFTGOODS) ×1 IMPLANT
SLING ARM FOAM STRAP MED (SOFTGOODS) IMPLANT
SLING ARM FOAM STRAP XLG (SOFTGOODS) IMPLANT
SPONGE GAUZE 4X4 12PLY (GAUZE/BANDAGES/DRESSINGS) ×2 IMPLANT
SPONGE LAP 18X18 X RAY DECT (DISPOSABLE) ×2 IMPLANT
STAPLER VISISTAT 35W (STAPLE) ×1 IMPLANT
STOCKINETTE IMPERVIOUS LG (DRAPES) ×2 IMPLANT
STRIP CLOSURE SKIN 1/2X4 (GAUZE/BANDAGES/DRESSINGS) ×1 IMPLANT
SUT ETHIBOND NAB OS 4 #2 30IN (SUTURE) IMPLANT
SUT ETHILON 3 0 FSL (SUTURE) IMPLANT
SUT FIBERWIRE #2 38 T-5 BLUE (SUTURE)
SUT MON AB 0 CT1 (SUTURE) ×2 IMPLANT
SUT MON AB 2-0 SH 27 (SUTURE) ×2
SUT MON AB 2-0 SH27 (SUTURE) ×1 IMPLANT
SUT PROLENE 3 0 PS 1 (SUTURE) IMPLANT
SUTURE FIBERWR #2 38 T-5 BLUE (SUTURE) IMPLANT
SYR 30ML LL (SYRINGE) ×2 IMPLANT
SYR BULB IRRIGATION 50ML (SYRINGE) ×4 IMPLANT
YANKAUER SUCT 12FT TUBE ARGYLE (SUCTIONS) ×2 IMPLANT

## 2012-02-12 NOTE — Transfer of Care (Signed)
Immediate Anesthesia Transfer of Care Note  Patient: Eddie Salazar  Procedure(s) Performed: Procedure(s) (LRB) with comments: DISTAL BICEPS TENDON REPAIR (Left)  Patient Location: PACU  Anesthesia Type: General  Level of Consciousness: awake and patient cooperative  Airway & Oxygen Therapy: Patient Spontanous Breathing and Patient connected to face mask oxygen  Post-op Assessment: Report given to PACU RN, Post -op Vital signs reviewed and stable and Patient moving all extremities  Post vital signs: Reviewed and stable  Complications: No apparent anesthesia complications

## 2012-02-12 NOTE — Anesthesia Postprocedure Evaluation (Signed)
  Anesthesia Post-op Note  Patient: Eddie Salazar  Procedure(s) Performed: Procedure(s) (LRB) with comments: DISTAL BICEPS TENDON REPAIR (Left)  Patient Location: PACU  Anesthesia Type: General  Level of Consciousness: awake, alert , oriented and patient cooperative  Airway and Oxygen Therapy: Patient Spontanous Breathing  Post-op Pain: 3 /10, mild  Post-op Assessment: Post-op Vital signs reviewed, Patient's Cardiovascular Status Stable, Respiratory Function Stable, Patent Airway, No signs of Nausea or vomiting and Pain level controlled  Post-op Vital Signs: Reviewed and stable  Complications: No apparent anesthesia complications

## 2012-02-12 NOTE — Anesthesia Preprocedure Evaluation (Addendum)
Anesthesia Evaluation  Patient identified by MRN, date of birth, ID band Patient awake    Reviewed: Allergy & Precautions, H&P , NPO status , Patient's Chart, lab work & pertinent test results  History of Anesthesia Complications Negative for: history of anesthetic complications  Airway Mallampati: II TM Distance: >3 FB Neck ROM: Full    Dental  (+) Teeth Intact   Pulmonary neg pulmonary ROS,  breath sounds clear to auscultation        Cardiovascular hypertension, Pt. on medications Rhythm:Regular Rate:Normal     Neuro/Psych    GI/Hepatic negative GI ROS,   Endo/Other    Renal/GU      Musculoskeletal   Abdominal   Peds  Hematology   Anesthesia Other Findings   Reproductive/Obstetrics                           Anesthesia Physical Anesthesia Plan  ASA: II  Anesthesia Plan: General   Post-op Pain Management:    Induction: Intravenous  Airway Management Planned: LMA  Additional Equipment:   Intra-op Plan:   Post-operative Plan: Extubation in OR  Informed Consent: I have reviewed the patients History and Physical, chart, labs and discussed the procedure including the risks, benefits and alternatives for the proposed anesthesia with the patient or authorized representative who has indicated his/her understanding and acceptance.     Plan Discussed with:   Anesthesia Plan Comments:         Anesthesia Quick Evaluation

## 2012-02-12 NOTE — Op Note (Signed)
02/12/2012  1:12 PM  PATIENT:  Eddie Salazar  51 y.o. male  PRE-OPERATIVE DIAGNOSIS:  left biceps tendon rupture  POST-OPERATIVE DIAGNOSIS:  left biceps tendon rupture  Operative findings torn biceps tendon from the radial tuberosity  PROCEDURE:  Procedure(s) (LRB) with comments: DISTAL BICEPS TENDON REPAIR (Left)  Details of procedure this patient was identified in the preop holding area and the left arm was marked as a surgical site. Consent was signed the chart was reviewed. He was taken to the operating room for general anesthesia in the supine position. The arm was then prepped and draped sterilely. A sterile tourniquet was used so after the timeout was completed the tourniquet was applied and the limb was exsanguinated with a 4 to Esmarch the tourniquet was elevated to 250 mm mercury where it stayed for 63 minutes.  A transverse incision was made 4 cm distal to the transverse crease of the cubital fossa. Subcutaneous tissue was divided bluntly to protect the lateral antebrachial cutaneous nerve. Blunt dissection was carried out in the subcutaneous tissue and the biceps tendon was identified and pulled distally with an Allis clamp it was debrided.  Blunt dissection was further carried down to the radial tuberosity was which was confirmed by rotation of the forearm and visualization of the distal stump of the biceps tendon. Small veins were coagulated large veins were preserved.  A whipstitch was placed through the biceps tendon at 2-1/2 cm and worked distally per technique from the Arthrex distal biceps tendon repair kit using fiber loop   Retractors were placed around the tuberosity with the arm in maximum supination and a straight tipped guidewire was passed through both cortices followed by over reaming of the guidewire with the 9 inch reamer through one cortex. Sutures were passed through the button and then passed through the hole in the radius. Engagement of the button was  confirmed and the tendon was cinched down into the tunnel. One suture was passed and tied. A bio tenodesis screw 8 x 12 mm from the Arthrex implant set was passed on the ulnar side of the tendon. Another suture was passed in a not was tied. The arm was taken through range of motion including pronation supination flexion extension and the tendon was found to be secured very well.  After irrigation the wound is closed with 2-0 Monocryl suture, Dermabond and Steri-Strips  A sterile dressing was applied the tourniquet was released the arm was placed in a sling.  The patient was then extubated taken to recovery room in stable condition  SURGEON:  Surgeon(s) and Role:    * Vickki Hearing, MD - Primary  PHYSICIAN ASSISTANT:   ASSISTANTS: debbie dallas   ANESTHESIA:   general  EBL:  Total I/O In: 600 [I.V.:600] Out: -   BLOOD ADMINISTERED:none  DRAINS: none   LOCAL MEDICATIONS USED:  MARCAINE   , Amount: 10 ml and OTHER epi  SPECIMEN:  No Specimen  DISPOSITION OF SPECIMEN:  N/A  COUNTS:  YES  TOURNIQUET:   Total Tourniquet Time Documented: Upper Arm (Left) - 65 minutes  DICTATION: .Reubin Milan Dictation  PLAN OF CARE: home   PATIENT DISPOSITION:  PACU - hemodynamically stable.   Delay start of Pharmacological VTE agent (>24hrs) due to surgical blood loss or risk of bleeding: not applicable

## 2012-02-12 NOTE — Brief Op Note (Signed)
02/12/2012  1:12 PM  PATIENT:  Eddie Salazar  51 y.o. male  PRE-OPERATIVE DIAGNOSIS:  left biceps tendon rupture  POST-OPERATIVE DIAGNOSIS:  left biceps tendon rupture  Operative findings torn biceps tendon from the radial tuberosity  PROCEDURE:  Procedure(s) (LRB) with comments: DISTAL BICEPS TENDON REPAIR (Left)  SURGEON:  Surgeon(s) and Role:    * Vickki Hearing, MD - Primary  PHYSICIAN ASSISTANT:   ASSISTANTS: debbie dallas   ANESTHESIA:   general  EBL:  Total I/O In: 600 [I.V.:600] Out: -   BLOOD ADMINISTERED:none  DRAINS: none   LOCAL MEDICATIONS USED:  MARCAINE   , Amount: 10 ml and OTHER epi  SPECIMEN:  No Specimen  DISPOSITION OF SPECIMEN:  N/A  COUNTS:  YES  TOURNIQUET:   Total Tourniquet Time Documented: Upper Arm (Left) - 65 minutes  DICTATION: .Reubin Milan Dictation  PLAN OF CARE: home   PATIENT DISPOSITION:  PACU - hemodynamically stable.   Delay start of Pharmacological VTE agent (>24hrs) due to surgical blood loss or risk of bleeding: not applicable

## 2012-02-12 NOTE — Interval H&P Note (Signed)
History and Physical Interval Note:  02/12/2012 11:22 AM  Eddie Salazar  has presented today for surgery, with the diagnosis of left biceps tendon rupture  The various methods of treatment have been discussed with the patient and family. After consideration of risks, benefits and other options for treatment, the patient has consented to  Procedure(s) (LRB) with comments: DISTAL BICEPS TENDON REPAIR (Left) as a surgical intervention .  The patient's history has been reviewed, patient examined, no change in status, stable for surgery.  I have reviewed the patient's chart and labs.  Questions were answered to the patient's satisfaction.     Fuller Canada

## 2012-02-12 NOTE — Anesthesia Procedure Notes (Signed)
Procedure Name: LMA Insertion Date/Time: 02/12/2012 11:36 AM Performed by: Despina Hidden Pre-anesthesia Checklist: Emergency Drugs available, Suction available, Patient identified and Patient being monitored Patient Re-evaluated:Patient Re-evaluated prior to inductionOxygen Delivery Method: Circle system utilized Preoxygenation: Pre-oxygenation with 100% oxygen Intubation Type: IV induction Ventilation: Mask ventilation without difficulty LMA: LMA inserted LMA Size: 3.0 Grade View: Grade I Tube type: Oral Number of attempts: 2 Placement Confirmation: positive ETCO2 and breath sounds checked- equal and bilateral Tube secured with: Tape Dental Injury: Teeth and Oropharynx as per pre-operative assessment and Bloody posterior oropharynx  Comments: #4 LMA too large.Marland KitchenMarland KitchenSmall amount blood in posterior oro-pharynx

## 2012-02-15 ENCOUNTER — Encounter: Payer: Self-pay | Admitting: Orthopedic Surgery

## 2012-02-15 ENCOUNTER — Ambulatory Visit (INDEPENDENT_AMBULATORY_CARE_PROVIDER_SITE_OTHER): Payer: Worker's Compensation | Admitting: Orthopedic Surgery

## 2012-02-15 VITALS — BP 134/90 | Ht 70.0 in | Wt 182.0 lb

## 2012-02-15 DIAGNOSIS — S43499A Other sprain of unspecified shoulder joint, initial encounter: Secondary | ICD-10-CM

## 2012-02-15 DIAGNOSIS — S46819A Strain of other muscles, fascia and tendons at shoulder and upper arm level, unspecified arm, initial encounter: Secondary | ICD-10-CM

## 2012-02-15 DIAGNOSIS — S46219A Strain of muscle, fascia and tendon of other parts of biceps, unspecified arm, initial encounter: Secondary | ICD-10-CM

## 2012-02-15 NOTE — Progress Notes (Signed)
Patient ID: Eddie Salazar, male   DOB: 06-04-1960, 51 y.o.   MRN: [id number] Chief Complaint  Patient presents with  . Routine Post Op    post op 1, left biceps tendon repair, DOS 02/12/12    The patient complains of numbness in the lateral antebrachial cutaneous nerve dermatome.  Otherwise doing well good capillary refill normal motor function in all areas.  Wound looks clean  Recommend active assisted range of motion exercises limit lifting to glass of water. 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

## 2012-02-15 NOTE — Patient Instructions (Addendum)
Do not lift any thing heavier than a glass of water   Wear sling as needed   Office work ok

## 2012-02-19 ENCOUNTER — Encounter (HOSPITAL_COMMUNITY): Payer: Self-pay | Admitting: Orthopedic Surgery

## 2012-02-25 ENCOUNTER — Encounter: Payer: Self-pay | Admitting: Orthopedic Surgery

## 2012-02-25 ENCOUNTER — Ambulatory Visit (INDEPENDENT_AMBULATORY_CARE_PROVIDER_SITE_OTHER): Payer: Worker's Compensation | Admitting: Orthopedic Surgery

## 2012-02-25 VITALS — Ht 70.0 in | Wt 182.0 lb

## 2012-02-25 DIAGNOSIS — S46219A Strain of muscle, fascia and tendon of other parts of biceps, unspecified arm, initial encounter: Secondary | ICD-10-CM

## 2012-02-25 DIAGNOSIS — S43499A Other sprain of unspecified shoulder joint, initial encounter: Secondary | ICD-10-CM

## 2012-02-25 NOTE — Progress Notes (Signed)
Patient ID: Eddie Salazar, male   DOB: 1961/02/05, 51 y.o.   MRN: [id number] Chief Complaint  Patient presents with  . Follow-up    10 day recheck and suture removal.   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

## 2012-02-25 NOTE — Patient Instructions (Signed)
Work on [employer] of motion

## 2012-03-24 ENCOUNTER — Encounter: Payer: Self-pay | Admitting: Orthopedic Surgery

## 2012-03-24 ENCOUNTER — Ambulatory Visit (INDEPENDENT_AMBULATORY_CARE_PROVIDER_SITE_OTHER): Payer: Worker's Compensation | Admitting: Orthopedic Surgery

## 2012-03-24 DIAGNOSIS — S43499A Other sprain of unspecified shoulder joint, initial encounter: Secondary | ICD-10-CM

## 2012-03-24 DIAGNOSIS — S46219A Strain of muscle, fascia and tendon of other parts of biceps, unspecified arm, initial encounter: Secondary | ICD-10-CM

## 2012-03-24 NOTE — Progress Notes (Signed)
Patient ID: Eddie Salazar, male   DOB: Jul 11, 1960, 51 y.o.   MRN: [id number] Chief Complaint  Patient presents with  . Follow-up    4 week recheck on left bicep tendon repair.    No diagnosis found. 1. Biceps tendon tear      Patient follows up after biceps tendon repair with tight rope left arm complicated by lateral antebrachial cutaneous nerve numbness  Fortunately this has resolved. He has a good palpable biceps tendon with good supination pronation flexion extension  Work on strengthening with a 5 pound weight for 6 weeks  Followup 6 weeks  Continue lifting restriction for 6 weeks.

## 2012-03-25 ENCOUNTER — Encounter: Payer: Self-pay | Admitting: Orthopedic Surgery

## 2012-05-05 ENCOUNTER — Ambulatory Visit (INDEPENDENT_AMBULATORY_CARE_PROVIDER_SITE_OTHER): Payer: Worker's Compensation | Admitting: Orthopedic Surgery

## 2012-05-05 ENCOUNTER — Encounter: Payer: Self-pay | Admitting: Orthopedic Surgery

## 2012-05-05 VITALS — BP 138/78 | Ht 70.0 in | Wt 182.0 lb

## 2012-05-05 DIAGNOSIS — S46219A Strain of muscle, fascia and tendon of other parts of biceps, unspecified arm, initial encounter: Secondary | ICD-10-CM

## 2012-05-05 DIAGNOSIS — S46819A Strain of other muscles, fascia and tendons at shoulder and upper arm level, unspecified arm, initial encounter: Secondary | ICD-10-CM

## 2012-05-05 DIAGNOSIS — S43499A Other sprain of unspecified shoulder joint, initial encounter: Secondary | ICD-10-CM

## 2012-05-05 NOTE — Progress Notes (Signed)
Patient ID: Eddie Salazar, male   DOB: February 13, 1961, 52 y.o.   MRN: [id number] Chief Complaint  Patient presents with  . Follow-up    Post op left bicep repair/date of surgery, February 12, 2012   Doing well he has FROM and normal hook test

## 2012-05-05 NOTE — Patient Instructions (Addendum)
activities as tolerated 

## 2012-05-06 ENCOUNTER — Encounter: Payer: Self-pay | Admitting: Orthopedic Surgery

## 2012-05-06 ENCOUNTER — Telehealth: Payer: Self-pay | Admitting: Orthopedic Surgery

## 2012-05-06 NOTE — Telephone Encounter (Signed)
As requested, physician appointment form, work note, and office notes faxed to Workers Comp case Production designer, theatre/television/film, Nicole Cella at [place], fax [phone],  ph [phone].

## 2012-05-12 ENCOUNTER — Telehealth: Payer: Self-pay | Admitting: Orthopedic Surgery

## 2012-05-12 NOTE — Telephone Encounter (Signed)
Received call from Nicole Cella, Workers Comp Sports coach.  States has patient's office notes, work note of release; states that PPD, permanent partial disability must be addressed, whether or not Dr. Romeo Apple has determined this for patient.  Please fax letter, even if "none", to her at [place], fax [phone]; Her phone # is [phone] if questions.

## 2012-05-13 NOTE — Telephone Encounter (Signed)
Called back to patient per Dr. Mort Sawyers note.  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  He mentioned that he does not have his full strength of hand and arm, and has found that with tasks such as using hose for extended periods, as in washing a vehicle, arm, arm, elbow gets fatigued with that type of use.  Please advise.  Patient's ph# [phone], if further questions.

## 2012-05-13 NOTE — Telephone Encounter (Signed)
Call tim for mee ask him if he percieves any PPD from his injury

## 2012-05-17 ENCOUNTER — Encounter: Payer: Self-pay | Admitting: Orthopedic Surgery

## 2012-05-17 NOTE — Telephone Encounter (Signed)
Permanent partial disability rating 10% letter has been dictated there are 2 letters one is full of her medical air is the second one is appropriate for sending to the employer insurance

## 2012-05-19 ENCOUNTER — Encounter: Payer: Self-pay | Admitting: Orthopedic Surgery

## 2012-05-19 NOTE — Telephone Encounter (Signed)
Letter faxed to Workers Comp case Production designer, theatre/television/film at fax#[phone] + copy sent to patient (2nd letter, per Dr Mort Sawyers note, due to 1st one, errors).

## 2012-06-04 ENCOUNTER — Other Ambulatory Visit: Payer: Self-pay

## 2013-02-23 ENCOUNTER — Other Ambulatory Visit: Payer: Self-pay

## 2013-04-27 ENCOUNTER — Encounter: Payer: Self-pay | Admitting: Orthopedic Surgery

## 2013-04-27 ENCOUNTER — Ambulatory Visit (INDEPENDENT_AMBULATORY_CARE_PROVIDER_SITE_OTHER): Payer: BC Managed Care – PPO | Admitting: Orthopedic Surgery

## 2013-04-27 ENCOUNTER — Ambulatory Visit (INDEPENDENT_AMBULATORY_CARE_PROVIDER_SITE_OTHER): Payer: BC Managed Care – PPO

## 2013-04-27 VITALS — BP 138/95 | Ht 70.0 in | Wt 175.0 lb

## 2013-04-27 DIAGNOSIS — M25569 Pain in unspecified knee: Secondary | ICD-10-CM

## 2013-04-27 DIAGNOSIS — M25562 Pain in left knee: Secondary | ICD-10-CM

## 2013-04-27 DIAGNOSIS — IMO0002 Reserved for concepts with insufficient information to code with codable children: Secondary | ICD-10-CM

## 2013-04-27 DIAGNOSIS — M1712 Unilateral primary osteoarthritis, left knee: Secondary | ICD-10-CM | POA: Insufficient documentation

## 2013-04-27 DIAGNOSIS — M1710 Unilateral primary osteoarthritis, unspecified knee: Secondary | ICD-10-CM

## 2013-04-27 NOTE — Patient Instructions (Signed)
Aleve as needed. 

## 2013-04-27 NOTE — Progress Notes (Signed)
Patient ID: Eddie Salazar, male   DOB: 20-Aug-1960, 53 y.o.   MRN: [id number] Established patient new problem  Left knee pain  The patient reports stiffness after sitting and pain in his left knee minimal swelling no catching locking giving way mild symptoms at this time relieved by one Aleve tablet. He doesn't have to take it everyday last 2-3 days. Previous history of meniscectomy.  Past Medical History  Diagnosis Date  . Hypertension    Past Surgical History  Procedure Laterality Date  . Hernia repair    . Colonoscopy  01/13/2012    Procedure: COLONOSCOPY;  Surgeon: Rogene Houston, MD;  Location: [facility];  Service: Endoscopy;  Laterality: N/A;  830  . Neck surgery    . Rotator cuff repair    . Foot surgery    . Distal biceps tendon repair  02/12/2012    Procedure: DISTAL BICEPS TENDON REPAIR;  Surgeon: Carole Civil, MD;  Location: [facility];  Service: Orthopedics;  Laterality: Left;   BP 138/95  Ht 5\' 10"  (1.778 m)  Wt 175 lb (79.379 kg)  BMI 25.11 kg/m2 The patient is well-built well-nourished well-groomed mood normal oriented x3. Gait normal. Inspection has a varus aligned knee he has full flexion today he says he does lose some motion in his knee is bothering him his knee is stable his motor exam is normal his skin is intact McMurray sign is negative his ligaments are stable his pulses good sensation is normal he has no lymphadenopathy  In the x-ray shows fairly preserved joint spaces except on the lateral view medial compartment is narrowed  Osteoarthritis  Continue Aleve as needed when that doesn't work weekends give him an injection and even Synvisc if needed

## 2014-03-14 ENCOUNTER — Telehealth: Payer: Self-pay | Admitting: Orthopedic Surgery

## 2014-03-14 NOTE — Telephone Encounter (Signed)
Patient called Monday 03/12/14 c/o shoulder pain and asking to be seen, I offered an appointment Tuesday 03/13/14 an he declined stated that he would be out of town. I have Left message on machine for a return call this morning to offer another appointment,

## 2014-03-19 NOTE — Telephone Encounter (Signed)
PATIENT CALLED AND IS SCHEDULED FOR 12/15

## 2014-03-21 ENCOUNTER — Ambulatory Visit (INDEPENDENT_AMBULATORY_CARE_PROVIDER_SITE_OTHER): Payer: BC Managed Care – PPO

## 2014-03-21 ENCOUNTER — Ambulatory Visit (INDEPENDENT_AMBULATORY_CARE_PROVIDER_SITE_OTHER): Payer: BC Managed Care – PPO | Admitting: Orthopedic Surgery

## 2014-03-21 ENCOUNTER — Encounter: Payer: Self-pay | Admitting: Orthopedic Surgery

## 2014-03-21 VITALS — BP 78/49 | Ht 70.0 in | Wt 182.0 lb

## 2014-03-21 DIAGNOSIS — M1712 Unilateral primary osteoarthritis, left knee: Secondary | ICD-10-CM

## 2014-03-21 DIAGNOSIS — M25511 Pain in right shoulder: Secondary | ICD-10-CM

## 2014-03-21 DIAGNOSIS — M25462 Effusion, left knee: Secondary | ICD-10-CM

## 2014-03-21 DIAGNOSIS — M75101 Unspecified rotator cuff tear or rupture of right shoulder, not specified as traumatic: Secondary | ICD-10-CM

## 2014-03-21 DIAGNOSIS — M179 Osteoarthritis of knee, unspecified: Secondary | ICD-10-CM

## 2014-03-21 NOTE — Progress Notes (Signed)
Patient ID: Eddie Salazar, male   DOB: 05/30/1960, 53 y.o.   MRN: [id number] Subjective:    Eddie Salazar is a 53 y.o. male who presents and right shoulder pain  Right shoulder pain without weakness or trauma noted. Pain with yard activity such as weeding in blowing leaves. Pain improved with rest and ibuprofen.  Status post left knee arthroscopy history of osteoarthritis pain is well controlled with Aleve arriving however the patient notices increasing stiffness and loss of motion and difficulties doing things that require knee flexion. Pain levels now 3 out of 10    Past Medical History  Diagnosis Date  . Hypertension     Past Surgical History  Procedure Laterality Date  . Hernia repair    . Colonoscopy  01/13/2012    Procedure: COLONOSCOPY;  Surgeon: Rogene Houston, MD;  Location: [facility];  Service: Endoscopy;  Laterality: N/A;  830  . Neck surgery    . Rotator cuff repair    . Foot surgery    . Distal biceps tendon repair  02/12/2012    Procedure: DISTAL BICEPS TENDON REPAIR;  Surgeon: Carole Civil, MD;  Location: [facility];  Service: Orthopedics;  Laterality: Left;  . Knee arthroscopy Right     Family History  Problem Relation Age of Onset  . Cancer    . Dementia Mother     Social History History  Substance Use Topics  . Smoking status: Never Smoker   . Smokeless tobacco: Never Used  . Alcohol Use: No    No Known Allergies  Current Outpatient Prescriptions  Medication Sig Dispense Refill  . ibuprofen (ADVIL,MOTRIN) 800 MG tablet Take 800 mg by mouth every 8 (eight) hours as needed.    Marland Kitchen losartan (COZAAR) 100 MG tablet Take 100 mg by mouth daily.    . naproxen sodium (ANAPROX) 220 MG tablet Take 220 mg by mouth 2 (two) times daily with a meal.    . HYDROcodone-acetaminophen (NORCO) 7.5-325 MG per tablet Take 1 tablet by mouth every 4 (four) hours as needed for pain. (Patient not taking: Reported on 03/21/2014) 30 tablet 0  . lisinopril  (PRINIVIL,ZESTRIL) 10 MG tablet Take 10 mg by mouth daily.    . promethazine (PHENERGAN) 12.5 MG tablet Take 1 tablet (12.5 mg total) by mouth every 6 (six) hours as needed for nausea. (Patient not taking: Reported on 03/21/2014) 30 tablet 0   No current facility-administered medications for this visit.      Review of Systems A comprehensive review of systems was negative except for: Musculoskeletal: positive for arthralgias and stiff joints   Objective:    BP 78/49 mmHg  Ht 5\' 10"  (1.778 m)  Wt 182 lb (82.555 kg)  BMI 26.11 kg/m2 BP 78/49 mmHg  Ht 5\' 10"  (1.778 m)  Wt 182 lb (82.555 kg)  BMI 26.11 kg/m2  Gen. appearance is normal The patient is alert and oriented person place and time Mood is normal affect is normal Ambulatory status normal   Right knee: no effusion, full active range of motion, no joint line tenderness, ligamentous structures intact. and Muscle strength muscle tone normal  Left knee:  positive exam findings: effusion, crepitus, medial joint line tenderness and ROM limited to approximately 110 degrees, negative exam findings: ACL stable, PCL stable, MCL stable, LCL stable, no patellar laxity and McMurray's negative and Mild varus   SKIN normal right shoulder normal left knee normal right knee  CV normal right radial artery pulse normal left  dorsalis pedis pulse  LYMPH normal axillary lymph nodes  SENSATION normal right and left upper extremity sensation normal left leg sensation  DTR not tested  COORDINATION BALANCE normal    Assessment:    Encounter Diagnoses  Name Primary?  . Right shoulder pain Yes  . Rotator cuff syndrome of right shoulder   . Effusion of knee joint, left   . Osteoarthritis of left knee, unspecified osteoarthritis type     X-ray left knee interpretation progressing arthritis  X-ray right interpretation shoulder normal glenohumeral joint with subtle chronic rotator cuff bone changes  Inject bilateral but separate joints  right shoulder left knee  Procedure note left knee injection verbal consent was obtained to inject left knee joint  Timeout was completed to confirm the site of injection  The medications used were 40 mg of Depo-Medrol and 1% lidocaine 3 cc  Anesthesia was provided by ethyl chloride and the skin was prepped with alcohol.  After cleaning the skin with alcohol a 20-gauge needle was used to inject the left knee joint. There were no complications. A sterile bandage was applied.   Procedure note the subacromial injection shoulder RIGHT   Verbal consent was obtained to inject the  RIGHT   Shoulder  Timeout was completed to confirm the injection site is a subacromial space of the  RIGHT  shoulder   Medication used Depo-Medrol 40 mg and lidocaine 1% 3 cc  Anesthesia was provided by ethyl chloride  The injection was performed in the RIGHT  posterior subacromial space. After pinning the skin with alcohol and anesthetized the skin with ethyl chloride the subacromial space was injected using a 20-gauge needle. There were no complications  Sterile dressing was applied.    Plan:

## 2014-03-26 ENCOUNTER — Ambulatory Visit: Payer: Self-pay | Admitting: Orthopedic Surgery

## 2014-08-27 ENCOUNTER — Ambulatory Visit (INDEPENDENT_AMBULATORY_CARE_PROVIDER_SITE_OTHER): Payer: BLUE CROSS/BLUE SHIELD | Admitting: Orthopedic Surgery

## 2014-08-27 VITALS — BP 121/66 | Ht 70.0 in | Wt 182.0 lb

## 2014-08-27 DIAGNOSIS — M75101 Unspecified rotator cuff tear or rupture of right shoulder, not specified as traumatic: Secondary | ICD-10-CM | POA: Diagnosis not present

## 2014-08-27 DIAGNOSIS — M1712 Unilateral primary osteoarthritis, left knee: Secondary | ICD-10-CM

## 2014-08-27 DIAGNOSIS — M179 Osteoarthritis of knee, unspecified: Secondary | ICD-10-CM

## 2014-08-27 MED ORDER — DICLOFENAC POTASSIUM 50 MG PO TABS: 50.0000 mg | ORAL_TABLET | Freq: Two times a day (BID) | ORAL | Status: DC

## 2014-08-27 NOTE — Patient Instructions (Addendum)
Patient will be precerted for Monovisc, then we will order it to be shipped to the patient's house.  Call when you receive the medicine to schedule an appointment.  Start new arthritis medicine.

## 2014-08-28 ENCOUNTER — Encounter: Payer: Self-pay | Admitting: Orthopedic Surgery

## 2014-08-28 NOTE — Progress Notes (Signed)
Patient ID: Eddie Salazar, male   DOB: 04-Nov-1960, 54 y.o.   MRN: [id number] Chief Complaint  Patient presents with  . Follow-up    6 month foolow up on right shoulder and left knee.    History the patient has had injection right shoulder for rotator cuff syndrome, she's had left knee arthroscopy and cortisone injection for osteoarthritis left knee comes in for reevaluation  Primary complaints today are #1 when he is standing on a riding lawn more his right leg will become symptomatic for pain and radiculitis like this. Left knee stiffness intermittent swelling intermittent. Ankle joints stiff after sitting. Knee stiff after sitting.  Current medications for joint pain include intermittent Aleve  System review he denies fever or erythema or warmth to the joints.  Physical exam vital signs BP 121/66 mmHg  Ht 5\' 10"  (1.778 m)  Wt 182 lb (82.555 kg)  BMI 26.11 kg/m2  Appearance remains normal, BMI 26   He's regained full range of motion of his right shoulder with no tenderness swelling or instability strength is normal   Left knee flexion today is 125 stability tests were normal nontender over the joint line no joint effusion motor exam normal  His 2 major problems of right shoulder cuff syndrome and left knee arthritis are stable. I would like to add an anti-inflammatory.  Meds ordered this encounter  Medications  . diclofenac (CATAFLAM) 50 MG tablet    Sig: Take 1 tablet (50 mg total) by mouth 2 (two) times daily.    Dispense:  90 tablet    Refill:  3    Schedule follow-up in 3 months to check off he does taking a new medication and also x-ray his left knee to check on the progress of his arthritis. He is a candidate for possible orthopedic is monovisc injection.

## 2014-09-20 ENCOUNTER — Ambulatory Visit: Payer: Self-pay | Admitting: Orthopedic Surgery

## 2015-01-10 ENCOUNTER — Other Ambulatory Visit (HOSPITAL_COMMUNITY): Payer: Self-pay | Admitting: Pulmonary Disease

## 2015-01-10 DIAGNOSIS — R945 Abnormal results of liver function studies: Principal | ICD-10-CM

## 2015-01-10 DIAGNOSIS — R7989 Other specified abnormal findings of blood chemistry: Secondary | ICD-10-CM

## 2015-01-14 ENCOUNTER — Ambulatory Visit (HOSPITAL_COMMUNITY)
Admission: RE | Admit: 2015-01-14 | Discharge: 2015-01-14 | Disposition: A | Discharge status: Home / Self Care | Payer: BLUE CROSS/BLUE SHIELD | Source: Ambulatory Visit | Attending: Pulmonary Disease | Admitting: Pulmonary Disease

## 2015-01-14 DIAGNOSIS — R7989 Other specified abnormal findings of blood chemistry: Secondary | ICD-10-CM | POA: Diagnosis not present

## 2015-01-14 DIAGNOSIS — R945 Abnormal results of liver function studies: Secondary | ICD-10-CM

## 2016-02-05 DIAGNOSIS — Z23 Encounter for immunization: Secondary | ICD-10-CM | POA: Diagnosis not present

## 2016-02-11 ENCOUNTER — Other Ambulatory Visit (HOSPITAL_COMMUNITY): Payer: Self-pay | Admitting: Pulmonary Disease

## 2016-02-11 DIAGNOSIS — R109 Unspecified abdominal pain: Secondary | ICD-10-CM

## 2016-02-11 DIAGNOSIS — Z0000 Encounter for general adult medical examination without abnormal findings: Secondary | ICD-10-CM | POA: Diagnosis not present

## 2016-02-14 ENCOUNTER — Ambulatory Visit (HOSPITAL_COMMUNITY)
Admission: RE | Admit: 2016-02-14 | Discharge: 2016-02-14 | Disposition: A | Discharge status: Home / Self Care | Payer: BLUE CROSS/BLUE SHIELD | Source: Ambulatory Visit | Attending: Pulmonary Disease | Admitting: Pulmonary Disease

## 2016-02-14 DIAGNOSIS — R109 Unspecified abdominal pain: Secondary | ICD-10-CM | POA: Diagnosis not present

## 2016-02-17 DIAGNOSIS — Z0000 Encounter for general adult medical examination without abnormal findings: Secondary | ICD-10-CM | POA: Diagnosis not present

## 2016-02-17 DIAGNOSIS — Z125 Encounter for screening for malignant neoplasm of prostate: Secondary | ICD-10-CM | POA: Diagnosis not present

## 2016-02-17 DIAGNOSIS — I10 Essential (primary) hypertension: Secondary | ICD-10-CM | POA: Diagnosis not present

## 2016-02-17 DIAGNOSIS — M545 Low back pain: Secondary | ICD-10-CM | POA: Diagnosis not present

## 2016-03-27 ENCOUNTER — Encounter: Payer: Self-pay | Admitting: Orthopedic Surgery

## 2016-03-27 ENCOUNTER — Ambulatory Visit (INDEPENDENT_AMBULATORY_CARE_PROVIDER_SITE_OTHER): Payer: BLUE CROSS/BLUE SHIELD

## 2016-03-27 ENCOUNTER — Ambulatory Visit (INDEPENDENT_AMBULATORY_CARE_PROVIDER_SITE_OTHER): Payer: BLUE CROSS/BLUE SHIELD | Admitting: Orthopedic Surgery

## 2016-03-27 VITALS — BP 147/97 | HR 92 | Ht 70.0 in | Wt 182.0 lb

## 2016-03-27 DIAGNOSIS — M5441 Lumbago with sciatica, right side: Secondary | ICD-10-CM | POA: Diagnosis not present

## 2016-03-27 DIAGNOSIS — M545 Low back pain, unspecified: Secondary | ICD-10-CM

## 2016-03-27 DIAGNOSIS — G8929 Other chronic pain: Secondary | ICD-10-CM | POA: Diagnosis not present

## 2016-03-27 DIAGNOSIS — M48061 Spinal stenosis, lumbar region without neurogenic claudication: Secondary | ICD-10-CM | POA: Diagnosis not present

## 2016-03-27 NOTE — Patient Instructions (Signed)
Follow-up after MRI

## 2016-03-28 NOTE — Progress Notes (Signed)
Patient ID: Eddie Salazar, male   DOB: [birth year], 55 y.o.   MRN: [id number]  Cc: RIGHT HIP PAIN   HPI Eddie Salazar is a 55 y.o. male.  PRESENTS FOR EVAL OF RIGHT HIP PAIN LOCATED ON THE RIGHT SIDE OF HIS LOWER BACK AND OVER THE LATERAL RIGHT HIP.  THIS BEGAN A FEW MONTHS AGO   HE WAS ABLE TO GO TO THERAPY AT THE REC OF A NEUROSURGEON WHO WAS SEEING HIS WIFE . THE EXERCISES DO HELP ESPECIALLY THE EXTENSION LORDOSIS RESTORATION EXERCISES   HE SAYS THAT THE PAIN OCCURS WHEN HE BENDS FORWARD AND HE FEELS A POPPIN WHEN DOING SO   HOWEVER HE DOES C/O OF INTERMITTENT RIDICULAR PAIN IN THE RIGHT LEG  HE CAN NO LONGER TAKE NSAIDS BECAUSE DICLOFENAC RESULTED IN ELEVATION OF HIS LFTS    Review of Systems Review of Systems  Constitutional: Negative for activity change, appetite change, fever and unexpected weight change.  Musculoskeletal: Positive for arthralgias and back pain. Negative for gait problem.  Skin: Negative for rash.  Neurological: Negative for tremors, weakness and numbness.    Past Medical History:  Diagnosis Date  . Hypertension     Past Surgical History:  Procedure Laterality Date  . COLONOSCOPY  01/13/2012   Procedure: COLONOSCOPY;  Surgeon: Rogene Houston, MD;  Location: [facility];  Service: Endoscopy;  Laterality: N/A;  830  . DISTAL BICEPS TENDON REPAIR  02/12/2012   Procedure: DISTAL BICEPS TENDON REPAIR;  Surgeon: Carole Civil, MD;  Location: [facility];  Service: Orthopedics;  Laterality: Left;  . FOOT SURGERY    . HERNIA REPAIR    . KNEE ARTHROSCOPY Right   . NECK SURGERY    . ROTATOR CUFF REPAIR      Family History  Problem Relation Age of Onset  . Cancer    . Dementia Mother    was reviewed  Social History Social History  Substance Use Topics  . Smoking status: Never Smoker  . Smokeless tobacco: Never Used  . Alcohol use No    No Known Allergies  Current Outpatient Prescriptions  Medication Sig Dispense Refill  . losartan  (COZAAR) 100 MG tablet Take 100 mg by mouth daily.     No current facility-administered medications for this visit.        Physical Exam Physical Exam  Constitutional: He is oriented to person, place, and time. He appears well-developed and well-nourished. No distress.  Cardiovascular: Normal rate and intact distal pulses.   Neurological: He is alert and oriented to person, place, and time.  Skin: Skin is warm and dry. No rash noted. He is not diaphoretic. No erythema. No pallor.  Psychiatric: He has a normal mood and affect. His behavior is normal. Judgment and thought content normal.    Ortho Exam   Neurologic examination  Reflexes were 2+ and equal  Sensation was normal in both feet and legs  Babinski's tests were down going  Straight leg raise testing was normal bilaterally  The vascular examination revealed normal dorsalis pedis pulses in both feet and both feet were warm with good capillary refill   Data Reviewed PLAIN FILMS LUMBAR SHOW FACET ARTHRITIS AT L3-4 AND 4-5   Assessment  Encounter Diagnoses  Name Primary?  . Chronic right-sided low back pain with right-sided sciatica Yes  . Spinal stenosis of lumbar region without neurogenic claudication       Plan  HE HAS HAD PT X 6 WEEKS AND NSSIDS BUT HAD  TO STOP FOR LIVER TOXICITY; REC MRI TO IMAGE NEURAL ELEMENTS FOR ESI OR SURGICAL DECOMPRESSION    Arther Abbott, MD 03/28/2016 12:40 PM

## 2016-03-31 ENCOUNTER — Telehealth: Payer: Self-pay | Admitting: Orthopedic Surgery

## 2016-03-31 NOTE — Telephone Encounter (Signed)
Patient called and wanted to know the status of MRI approval.  He will unavailable until about 3:30 this afternoon.  At that time, he will call us back to see if this is approved.

## 2016-03-31 NOTE — Telephone Encounter (Signed)
I HAVE NOT HAD A CHANCE TO DO ANY PRECERTS YET, WILL CALL HIM WHEN APPROVAL RECEIVED AND MRI SCHEDULED

## 2016-04-01 DIAGNOSIS — M545 Low back pain: Secondary | ICD-10-CM | POA: Diagnosis not present

## 2016-04-03 ENCOUNTER — Ambulatory Visit (HOSPITAL_COMMUNITY)
Admission: RE | Admit: 2016-04-03 | Discharge: 2016-04-03 | Disposition: A | Discharge status: Home / Self Care | Payer: BLUE CROSS/BLUE SHIELD | Source: Ambulatory Visit | Attending: Family Medicine | Admitting: Family Medicine

## 2016-04-03 ENCOUNTER — Ambulatory Visit (HOSPITAL_COMMUNITY)
Admission: RE | Admit: 2016-04-03 | Discharge: 2016-04-03 | Disposition: A | Discharge status: Home / Self Care | Payer: BLUE CROSS/BLUE SHIELD | Source: Ambulatory Visit | Attending: Orthopedic Surgery | Admitting: Orthopedic Surgery

## 2016-04-03 ENCOUNTER — Other Ambulatory Visit (HOSPITAL_COMMUNITY): Payer: Self-pay | Admitting: Family Medicine

## 2016-04-03 DIAGNOSIS — M545 Low back pain: Secondary | ICD-10-CM | POA: Diagnosis not present

## 2016-04-03 DIAGNOSIS — R102 Pelvic and perineal pain: Secondary | ICD-10-CM

## 2016-04-03 DIAGNOSIS — M4316 Spondylolisthesis, lumbar region: Secondary | ICD-10-CM | POA: Insufficient documentation

## 2016-04-03 DIAGNOSIS — M48061 Spinal stenosis, lumbar region without neurogenic claudication: Secondary | ICD-10-CM | POA: Insufficient documentation

## 2016-04-06 ENCOUNTER — Ambulatory Visit: Payer: Self-pay | Admitting: Orthopedic Surgery

## 2016-04-14 DIAGNOSIS — M545 Low back pain: Secondary | ICD-10-CM | POA: Diagnosis not present

## 2016-04-14 DIAGNOSIS — M533 Sacrococcygeal disorders, not elsewhere classified: Secondary | ICD-10-CM | POA: Diagnosis not present

## 2016-05-18 DIAGNOSIS — M4696 Unspecified inflammatory spondylopathy, lumbar region: Secondary | ICD-10-CM | POA: Diagnosis not present

## 2016-05-20 DIAGNOSIS — M47816 Spondylosis without myelopathy or radiculopathy, lumbar region: Secondary | ICD-10-CM | POA: Diagnosis not present

## 2016-05-27 DIAGNOSIS — M1711 Unilateral primary osteoarthritis, right knee: Secondary | ICD-10-CM | POA: Diagnosis not present

## 2016-05-27 DIAGNOSIS — M1712 Unilateral primary osteoarthritis, left knee: Secondary | ICD-10-CM | POA: Diagnosis not present

## 2016-06-04 DIAGNOSIS — M47816 Spondylosis without myelopathy or radiculopathy, lumbar region: Secondary | ICD-10-CM | POA: Diagnosis not present

## 2016-06-26 DIAGNOSIS — M47816 Spondylosis without myelopathy or radiculopathy, lumbar region: Secondary | ICD-10-CM | POA: Diagnosis not present

## 2017-01-15 ENCOUNTER — Encounter (INDEPENDENT_AMBULATORY_CARE_PROVIDER_SITE_OTHER): Payer: Self-pay | Admitting: *Deleted

## 2017-02-01 DIAGNOSIS — Z0000 Encounter for general adult medical examination without abnormal findings: Secondary | ICD-10-CM | POA: Diagnosis not present

## 2017-02-02 ENCOUNTER — Emergency Department (HOSPITAL_COMMUNITY): Payer: BLUE CROSS/BLUE SHIELD

## 2017-02-02 ENCOUNTER — Encounter (HOSPITAL_COMMUNITY): Payer: Self-pay | Admitting: *Deleted

## 2017-02-02 ENCOUNTER — Emergency Department (HOSPITAL_COMMUNITY)
Admission: EM | Admit: 2017-02-02 | Discharge: 2017-02-02 | Disposition: A | Discharge status: Home / Self Care | Payer: BLUE CROSS/BLUE SHIELD | Attending: Emergency Medicine | Admitting: Emergency Medicine

## 2017-02-02 DIAGNOSIS — N200 Calculus of kidney: Secondary | ICD-10-CM | POA: Diagnosis not present

## 2017-02-02 DIAGNOSIS — Z87442 Personal history of urinary calculi: Secondary | ICD-10-CM | POA: Insufficient documentation

## 2017-02-02 DIAGNOSIS — I10 Essential (primary) hypertension: Secondary | ICD-10-CM | POA: Insufficient documentation

## 2017-02-02 DIAGNOSIS — R109 Unspecified abdominal pain: Secondary | ICD-10-CM | POA: Diagnosis not present

## 2017-02-02 DIAGNOSIS — Z0000 Encounter for general adult medical examination without abnormal findings: Secondary | ICD-10-CM | POA: Diagnosis not present

## 2017-02-02 DIAGNOSIS — Z125 Encounter for screening for malignant neoplasm of prostate: Secondary | ICD-10-CM | POA: Diagnosis not present

## 2017-02-02 HISTORY — DX: Calculus of kidney: N20.0

## 2017-02-02 LAB — CBC WITH DIFFERENTIAL/PLATELET
BASOS PCT: 0 %
Basophils Absolute: 0 10*3/uL (ref 0.0–0.1)
Eosinophils Absolute: 0.2 10*3/uL (ref 0.0–0.7)
Eosinophils Relative: 2 %
HEMATOCRIT: 43.3 % (ref 39.0–52.0)
Hemoglobin: 14.3 g/dL (ref 13.0–17.0)
Lymphocytes Relative: 26 %
Lymphs Abs: 2.5 10*3/uL (ref 0.7–4.0)
MCH: 27.9 pg (ref 26.0–34.0)
MCHC: 33 g/dL (ref 30.0–36.0)
MCV: 84.4 fL (ref 78.0–100.0)
MONO ABS: 0.6 10*3/uL (ref 0.1–1.0)
MONOS PCT: 6 %
NEUTROS ABS: 6.3 10*3/uL (ref 1.7–7.7)
Neutrophils Relative %: 66 %
Platelets: 335 10*3/uL (ref 150–400)
RBC: 5.13 MIL/uL (ref 4.22–5.81)
RDW: 13.8 % (ref 11.5–15.5)
WBC: 9.5 10*3/uL (ref 4.0–10.5)

## 2017-02-02 LAB — URINALYSIS, ROUTINE W REFLEX MICROSCOPIC
BILIRUBIN URINE: NEGATIVE
GLUCOSE, UA: NEGATIVE mg/dL
Hgb urine dipstick: NEGATIVE
KETONES UR: NEGATIVE mg/dL
Leukocytes, UA: NEGATIVE
Nitrite: NEGATIVE
PH: 5 (ref 5.0–8.0)
Protein, ur: NEGATIVE mg/dL
Specific Gravity, Urine: 1.03 (ref 1.005–1.030)

## 2017-02-02 LAB — BASIC METABOLIC PANEL
Anion gap: 9 (ref 5–15)
BUN: 17 mg/dL (ref 6–20)
CALCIUM: 9.3 mg/dL (ref 8.9–10.3)
CO2: 26 mmol/L (ref 22–32)
CREATININE: 1.22 mg/dL (ref 0.61–1.24)
Chloride: 104 mmol/L (ref 101–111)
GFR calc non Af Amer: >60 mL/min (ref >60)
GLUCOSE: 130 mg/dL — AB (ref 65–99)
Potassium: 3.6 mmol/L (ref 3.5–5.1)
Sodium: 139 mmol/L (ref 135–145)

## 2017-02-02 MED ORDER — IOPAMIDOL (ISOVUE-300) INJECTION 61%
100.0000 mL | Freq: Once | INTRAVENOUS | Status: AC | PRN
  Administered 2017-02-02: 100 mL via INTRAVENOUS

## 2017-02-02 MED ORDER — ONDANSETRON HCL 4 MG PO TABS: 4.0000 mg | ORAL_TABLET | Freq: Three times a day (TID) | ORAL | 0 refills | Status: DC | PRN

## 2017-02-02 MED ORDER — SODIUM CHLORIDE 0.9 % IV BOLUS (SEPSIS)
1000.0000 mL | Freq: Once | INTRAVENOUS | Status: AC
  Administered 2017-02-02: 1000 mL via INTRAVENOUS

## 2017-02-02 MED ORDER — HYDROMORPHONE HCL 1 MG/ML IJ SOLN
1.0000 mg | Freq: Once | INTRAMUSCULAR | Status: AC
  Administered 2017-02-02: 1 mg via INTRAVENOUS
  Filled 2017-02-02: qty 1

## 2017-02-02 MED ORDER — OXYCODONE-ACETAMINOPHEN 5-325 MG PO TABS: 1.0000 | ORAL_TABLET | ORAL | 0 refills | Status: DC | PRN

## 2017-02-02 MED ORDER — ONDANSETRON HCL 4 MG/2ML IJ SOLN
4.0000 mg | Freq: Once | INTRAMUSCULAR | Status: AC
  Administered 2017-02-02: 4 mg via INTRAVENOUS
  Filled 2017-02-02: qty 2

## 2017-02-02 NOTE — Discharge Instructions (Signed)
Your Ct shows that you passed a small stone into your bladder. There is still a small amount of sludge/sand just above the bladder that you may pass.  You are given follow-up with urology if you need it, but you can also follow-up with your primary care doctor.  Return for worsening symptoms, including fever, escalating pain, intractable vomiting or any other symptoms concerning to you.

## 2017-02-02 NOTE — ED Triage Notes (Signed)
Pt c/o right side flank pain with nausea that started this afternoon, has hx of kidney stones,

## 2017-02-02 NOTE — ED Provider Notes (Signed)
[facility] EMERGENCY DEPARTMENT Provider Note   CSN: [id number] Arrival date & time: 02/02/17  1950     History   Chief Complaint Chief Complaint  Patient presents with  . Flank Pain    HPI Eddie Salazar is a 56 y.o. male.  The history is provided by the patient.  Flank Pain  This is a new problem. The current episode started 12 to 24 hours ago. The problem occurs constantly. The problem has been gradually worsening. Pertinent negatives include no chest pain, no abdominal pain and no shortness of breath. Nothing aggravates the symptoms. Nothing relieves the symptoms. He has tried nothing for the symptoms.   56 year old male who presents with right-sided flank pain, sudden onset this afternoon while he was at work. Pain comes in sharp waves, localized to the right flank. History of kidney stones, and he thinks it may have had similar presentation but was very long ago. Denies any hematuria, urinary frequency, dysuria, fevers, abdominal pain, vomiting or diarrhea. Has felt nauseated with pain. Has not tried any medications prior to arrival. Past Medical History:  Diagnosis Date  . Hypertension   . Kidney stones     Patient Active Problem List   Diagnosis Date Noted  . Osteoarthritis of left knee 04/27/2013  . Biceps tendon tear 02/01/2012  . LOOSE BODY-KNEE 06/25/2009  . TEAR MEDIAL MENISCUS 06/25/2009  . TEAR M C L 06/25/2009  . KNEE, ARTHRITIS, DEGEN./OSTEO 06/18/2009  . DERANGEMENT MENISCUS 06/18/2009    Past Surgical History:  Procedure Laterality Date  . COLONOSCOPY  01/13/2012   Procedure: COLONOSCOPY;  Surgeon: Rogene Houston, MD;  Location: [facility];  Service: Endoscopy;  Laterality: N/A;  830  . DISTAL BICEPS TENDON REPAIR  02/12/2012   Procedure: DISTAL BICEPS TENDON REPAIR;  Surgeon: Carole Civil, MD;  Location: [facility];  Service: Orthopedics;  Laterality: Left;  . FOOT SURGERY    . HERNIA REPAIR    . KNEE ARTHROSCOPY Right   . NECK SURGERY     . ROTATOR CUFF REPAIR         Home Medications    Prior to Admission medications   Medication Sig Start Date End Date Taking? Authorizing Provider  losartan (COZAAR) 100 MG tablet Take 100 mg by mouth daily.   Yes [provider]  meloxicam (MOBIC) 15 MG tablet Take 15 mg by mouth daily as needed for pain.  11/27/16  Yes [provider]  doxycycline (VIBRAMYCIN) 100 MG capsule Take 100 mg by mouth 2 (two) times daily.  02/01/17   [provider]  ondansetron (ZOFRAN) 4 MG tablet Take 1 tablet (4 mg total) by mouth every 8 (eight) hours as needed for nausea or vomiting. 02/02/17   Forde Dandy, MD  oxyCODONE-acetaminophen (PERCOCET/ROXICET) 5-325 MG tablet Take 1 tablet by mouth every 4 (four) hours as needed for moderate pain or severe pain. 02/02/17   Forde Dandy, MD    Family History Family History  Problem Relation Age of Onset  . Dementia Mother   . Cancer Unknown     Social History Social History  Substance Use Topics  . Smoking status: Never Smoker  . Smokeless tobacco: Never Used  . Alcohol use No     Allergies   Patient has no known allergies.   Review of Systems Review of Systems  Constitutional: Negative for fever.  Respiratory: Negative for shortness of breath.   Cardiovascular: Negative for chest pain.  Gastrointestinal: Negative for abdominal  pain.  Genitourinary: Positive for flank pain. Negative for dysuria, frequency and hematuria.  All other systems reviewed and are negative.    Physical Exam Updated Vital Signs BP (!) 141/82 (BP Location: Right Arm)   Pulse 71   Temp 99 F (37.2 C) (Oral)   Resp 18   Ht 5\' 10"  (1.778 m)   Wt 78 kg (172 lb)   SpO2 95%   BMI 24.68 kg/m   Physical Exam Physical Exam  Nursing note and vitals reviewed. Constitutional: Well developed, well nourished, non-toxic, and in no acute distress Head: Normocephalic and atraumatic.  Mouth/Throat: Oropharynx is clear and moist.  Neck:  Normal range of motion. Neck supple.  Cardiovascular: Normal rate and regular rhythm.   Pulmonary/Chest: Effort normal and breath sounds normal.  Abdominal: Soft. There is no tenderness. There is no rebound and no guarding.  right CVA tenderness Musculoskeletal: Normal range of motion.  Neurological: Alert, no facial droop, fluent speech, moves all extremities symmetrically Skin: Skin is warm and dry.  Psychiatric: Cooperative   ED Treatments / Results  Labs (all labs ordered are listed, but only abnormal results are displayed) Labs Reviewed  BASIC METABOLIC PANEL - Abnormal; Notable for the following:       Result Value   Glucose, Bld 130 (*)    All other components within normal limits  CBC WITH DIFFERENTIAL/PLATELET  URINALYSIS, ROUTINE W REFLEX MICROSCOPIC    EKG  EKG Interpretation None       Radiology Ct Abdomen Pelvis W Contrast  Result Date: 02/02/2017 CLINICAL DATA:  Right-sided flank pain EXAM: CT ABDOMEN AND PELVIS WITH CONTRAST TECHNIQUE: Multidetector CT imaging of the abdomen and pelvis was performed using the standard protocol following bolus administration of intravenous contrast. CONTRAST:  110mL ISOVUE-300 IOPAMIDOL (ISOVUE-300) INJECTION 61% COMPARISON:  Ultrasound 02/14/2016 FINDINGS: Lower chest: Lung bases demonstrate patchy dependent atelectasis. Normal heart size. Hepatobiliary: Subcentimeter hypodensities in the posterior right hepatic lobe too small to further characterize. No calcified gallstones. No biliary dilatation. Pancreas: Unremarkable. No pancreatic ductal dilatation or surrounding inflammatory changes. Spleen: Normal in size without focal abnormality. Adrenals/Urinary Tract: Adrenal glands are within normal limits. Slight prominence of the right renal pelvis and ureter. Coronal views demonstrate possible sand like stones in the distal right ureter about 1-2 cm proximal to the right UVJ. There is a punctate calcification within the posterior  bladder, likely due to a recently passed stone. Stomach/Bowel: Stomach is within normal limits. Appendix appears normal. No evidence of bowel wall thickening, distention, or inflammatory changes. Vascular/Lymphatic: No significant vascular findings are present. No enlarged abdominal or pelvic lymph nodes. Reproductive: Prostate is unremarkable. Other: Negative for free air or free fluid. Small fat in the inguinal canals. Musculoskeletal: Degenerative changes. Trace anterolisthesis of L4 on L5. No acute or suspicious bone lesion IMPRESSION: 1. Slight prominence of the right renal pelvis and right ureter. Suspected punctate/sandlike stones in the distal right ureter, just proximal to the right UVJ. Additional punctate stone in the posterior bladder, likely due to a recently passed stone. 2. Normal appendix Electronically Signed   By: Donavan Foil M.D.   On: 02/02/2017 23:02    Procedures Procedures (including critical care time)  Medications Ordered in ED Medications  sodium chloride 0.9 % bolus 1,000 mL (0 mLs Intravenous Stopped 02/02/17 2255)  HYDROmorphone (DILAUDID) injection 1 mg (1 mg Intravenous Given 02/02/17 2113)  ondansetron (ZOFRAN) injection 4 mg (4 mg Intravenous Given 02/02/17 2113)  iopamidol (ISOVUE-300) 61 % injection 100 mL (100  mLs Intravenous Contrast Given 02/02/17 2238)     Initial Impression / Assessment and Plan / ED Course  I have reviewed the triage vital signs and the nursing notes.  Pertinent labs & imaging results that were available during my care of the patient were reviewed by me and considered in my medical decision making (see chart for details).     Presents with sudden onset of intermittent right flank pain. Appears very uncomfortable and presentation. Notable right CVA tenderness but no abdominal tenderness. Afebrile and hemodynamically stable. I initially suspected kidney stone, however urinalysis does not show blood or infection. Blood work is  reassuring.  Subsequently performed CT abdomen and pelvis to look for other etiology of his symptoms. CT visualized and he does have a punctate stone which she's recently passed into the bladder. Small amount of sludge/sand at the UVJ.   Pain resolved in ED after IVF, pain control. Likely improved more due to passage of stone in bladder. Discussed outpatient follow-up with PCP. Urology referral given as needed.  Strict return and follow-up instructions reviewed. He expressed understanding of all discharge instructions and felt comfortable with the plan of care.   Final Clinical Impressions(s) / ED Diagnoses   Final diagnoses:  Kidney stone    New Prescriptions New Prescriptions   ONDANSETRON (ZOFRAN) 4 MG TABLET    Take 1 tablet (4 mg total) by mouth every 8 (eight) hours as needed for nausea or vomiting.   OXYCODONE-ACETAMINOPHEN (PERCOCET/ROXICET) 5-325 MG TABLET    Take 1 tablet by mouth every 4 (four) hours as needed for moderate pain or severe pain.     Forde Dandy, MD 02/02/17 [phone]

## 2017-02-03 DIAGNOSIS — Z23 Encounter for immunization: Secondary | ICD-10-CM | POA: Diagnosis not present

## 2017-02-08 ENCOUNTER — Encounter (INDEPENDENT_AMBULATORY_CARE_PROVIDER_SITE_OTHER): Payer: Self-pay | Admitting: *Deleted

## 2017-02-16 ENCOUNTER — Other Ambulatory Visit (INDEPENDENT_AMBULATORY_CARE_PROVIDER_SITE_OTHER): Payer: Self-pay | Admitting: *Deleted

## 2017-02-16 DIAGNOSIS — Z800 Family history of malignant neoplasm of digestive organs: Secondary | ICD-10-CM | POA: Insufficient documentation

## 2017-02-16 DIAGNOSIS — Z860100 Personal history of colon polyps, unspecified: Secondary | ICD-10-CM | POA: Insufficient documentation

## 2017-03-26 ENCOUNTER — Encounter (INDEPENDENT_AMBULATORY_CARE_PROVIDER_SITE_OTHER): Payer: Self-pay | Admitting: *Deleted

## 2017-03-26 ENCOUNTER — Telehealth (INDEPENDENT_AMBULATORY_CARE_PROVIDER_SITE_OTHER): Payer: Self-pay | Admitting: *Deleted

## 2017-03-26 NOTE — Telephone Encounter (Signed)
Patient needs trilyte 

## 2017-04-01 MED ORDER — PEG 3350-KCL-NA BICARB-NACL 420 G PO SOLR: 4000.0000 mL | Freq: Once | ORAL | 0 refills | Status: AC

## 2017-04-15 ENCOUNTER — Telehealth (INDEPENDENT_AMBULATORY_CARE_PROVIDER_SITE_OTHER): Payer: Self-pay | Admitting: *Deleted

## 2017-04-15 NOTE — Telephone Encounter (Signed)
Referring MD/PCP: hwakins   Procedure: tcs  Reason/Indication:  Hx polyps, fa, hx colon ca  Has patient had this procedure before?  Yes, 2013  If so, when, by whom and where?    Is there a family history of colon cancer?  Yes, father  Who?  What age when diagnosed?    Is patient diabetic?   no      Does patient have prosthetic heart valve or mechanical valve?  no  Do you have a pacemaker?  no  Has patient ever had endocarditis? no  Has patient had joint replacement within last 12 months?  no  Is patient constipated or take laxatives? no  Does patient have a history of alcohol/drug use?  no  Is patient on Coumadin, Plavix and/or Aspirin? no  Medications: see epic  Allergies: nkda  Medication Adjustment per Dr Laural Golden:   Procedure date & time: 02/16/18 at 915

## 2017-04-15 NOTE — Telephone Encounter (Signed)
agree

## 2017-05-19 ENCOUNTER — Encounter (HOSPITAL_COMMUNITY)
Admission: RE | Disposition: A | Discharge status: Home / Self Care | Payer: Self-pay | Source: Ambulatory Visit | Attending: Internal Medicine

## 2017-05-19 ENCOUNTER — Other Ambulatory Visit: Payer: Self-pay

## 2017-05-19 ENCOUNTER — Encounter (HOSPITAL_COMMUNITY): Payer: Self-pay | Admitting: *Deleted

## 2017-05-19 ENCOUNTER — Ambulatory Visit (HOSPITAL_COMMUNITY)
Admission: RE | Admit: 2017-05-19 | Discharge: 2017-05-19 | Disposition: A | Discharge status: Home / Self Care | Payer: BLUE CROSS/BLUE SHIELD | Source: Ambulatory Visit | Attending: Internal Medicine | Admitting: Internal Medicine

## 2017-05-19 DIAGNOSIS — Z79899 Other long term (current) drug therapy: Secondary | ICD-10-CM | POA: Insufficient documentation

## 2017-05-19 DIAGNOSIS — Z860100 Personal history of colon polyps, unspecified: Secondary | ICD-10-CM | POA: Insufficient documentation

## 2017-05-19 DIAGNOSIS — K648 Other hemorrhoids: Secondary | ICD-10-CM | POA: Diagnosis not present

## 2017-05-19 DIAGNOSIS — Z800 Family history of malignant neoplasm of digestive organs: Secondary | ICD-10-CM | POA: Diagnosis not present

## 2017-05-19 DIAGNOSIS — I10 Essential (primary) hypertension: Secondary | ICD-10-CM | POA: Diagnosis not present

## 2017-05-19 DIAGNOSIS — Z09 Encounter for follow-up examination after completed treatment for conditions other than malignant neoplasm: Secondary | ICD-10-CM | POA: Diagnosis not present

## 2017-05-19 DIAGNOSIS — Z1211 Encounter for screening for malignant neoplasm of colon: Secondary | ICD-10-CM | POA: Insufficient documentation

## 2017-05-19 HISTORY — PX: COLONOSCOPY: SHX5424

## 2017-05-19 LAB — HM COLONOSCOPY

## 2017-05-19 SURGERY — COLONOSCOPY
Anesthesia: Moderate Sedation

## 2017-05-19 MED ORDER — MEPERIDINE HCL 50 MG/ML IJ SOLN
INTRAMUSCULAR | Status: AC
  Filled 2017-05-19: qty 1

## 2017-05-19 MED ORDER — STERILE WATER FOR IRRIGATION IR SOLN
Status: DC | PRN
  Administered 2017-05-19: 09:00:00

## 2017-05-19 MED ORDER — SODIUM CHLORIDE 0.9 % IV SOLN
INTRAVENOUS | Status: DC
  Administered 2017-05-19: 1000 mL via INTRAVENOUS

## 2017-05-19 MED ORDER — MIDAZOLAM HCL 5 MG/5ML IJ SOLN
INTRAMUSCULAR | Status: DC | PRN
  Administered 2017-05-19: 2 mg via INTRAVENOUS
  Administered 2017-05-19: 3 mg via INTRAVENOUS
  Administered 2017-05-19 (×2): 2 mg via INTRAVENOUS

## 2017-05-19 MED ORDER — MIDAZOLAM HCL 5 MG/5ML IJ SOLN
INTRAMUSCULAR | Status: AC
  Filled 2017-05-19: qty 10

## 2017-05-19 MED ORDER — MEPERIDINE HCL 50 MG/ML IJ SOLN
INTRAMUSCULAR | Status: DC | PRN
  Administered 2017-05-19 (×2): 25 mg via INTRAVENOUS

## 2017-05-19 NOTE — H&P (Signed)
Eddie Salazar is an 57 y.o. male.   Chief Complaint: Patient is here for colonoscopy. HPI: Patient is 58 year old Caucasian male who is here for screening colonoscopy.  He denies abdominal pain change in bowel habits or rectal bleeding.  Last colonoscopy was in September 2013 with removal of small rectal polyp and was hyperplastic.  Family history significant for CRC and father who was in his late 55s at the time of diagnosis.  He died at age 9 2 years ago for unrelated causes.  Past Medical History:  Diagnosis Date  . Hypertension   . Kidney stones     Past Surgical History:  Procedure Laterality Date  . COLONOSCOPY  01/13/2012   Procedure: COLONOSCOPY;  Surgeon: Rogene Houston, MD;  Location: [facility];  Service: Endoscopy;  Laterality: N/A;  830  . DISTAL BICEPS TENDON REPAIR  02/12/2012   Procedure: DISTAL BICEPS TENDON REPAIR;  Surgeon: Carole Civil, MD;  Location: [facility];  Service: Orthopedics;  Laterality: Left;  . FOOT SURGERY    . HERNIA REPAIR    . KNEE ARTHROSCOPY Right   . NECK SURGERY    . ROTATOR CUFF REPAIR      Family History  Problem Relation Age of Onset  . Dementia Mother   . Cancer Unknown    Social History:  reports that  has never smoked. he has never used smokeless tobacco. He reports that he does not drink alcohol or use drugs.  Allergies:  Allergies  Allergen Reactions  . Beef-Derived Products Other (See Comments)    Due to tick bite, can eat with Benadryl     Medications Prior to Admission  Medication Sig Dispense Refill  . losartan (COZAAR) 100 MG tablet Take 100 mg by mouth daily.    . meloxicam (MOBIC) 15 MG tablet Take 15 mg by mouth daily as needed for pain.   0  . ondansetron (ZOFRAN) 4 MG tablet Take 1 tablet (4 mg total) by mouth every 8 (eight) hours as needed for nausea or vomiting. (Patient not taking: Reported on 05/10/2017) 20 tablet 0  . oxyCODONE-acetaminophen (PERCOCET/ROXICET) 5-325 MG tablet Take 1 tablet by mouth  every 4 (four) hours as needed for moderate pain or severe pain. (Patient not taking: Reported on 05/10/2017) 6 tablet 0    No results found for this or any previous visit (from the past 48 hour(s)). No results found.  ROS  Blood pressure (!) 149/92, pulse 78, temperature 97.8 F (36.6 C), temperature source Oral, resp. rate 10, height 5\' 10"  (1.778 m), weight 175 lb (79.4 kg), SpO2 98 %. Physical Exam  Constitutional: He appears well-developed and well-nourished.  HENT:  Mouth/Throat: Oropharynx is clear and moist.  Eyes: Conjunctivae are normal. No scleral icterus.  Neck: No thyromegaly present.  Cardiovascular: Normal rate, regular rhythm and normal heart sounds.  No murmur heard. Respiratory: Effort normal and breath sounds normal.  GI: Soft. He exhibits no distension and no mass. There is no tenderness.  Musculoskeletal: He exhibits no edema.  Lymphadenopathy:    He has no cervical adenopathy.  Neurological: He is alert.  Skin: Skin is warm and dry.     Assessment/Plan High risk screening colonoscopy.  Hildred Laser, MD 05/19/2017, 9:05 AM

## 2017-05-19 NOTE — Op Note (Signed)
[facility] Patient Name: Eddie Salazar Procedure Date: 05/19/2017 8:47 AM MRN: [id number] Date of Birth: [birth year] Attending MD: Hildred Laser , MD CSN: [id number] Age: 57 Admit Type: Outpatient Procedure:                Colonoscopy Indications:              Screening in patient at increased risk: Colorectal                            cancer in father before age 58 Providers:                Hildred Laser, MD, Otis Peak B. Sharon Seller, RN, Aram Candela Referring MD:             Jasper Loser. Luan Pulling, MD Medicines:                Meperidine 50 mg IV, Midazolam 9 mg IV Complications:            No immediate complications. Estimated Blood Loss:     Estimated blood loss: none. Procedure:                Pre-Anesthesia Assessment:                           - Prior to the procedure, a History and Physical                            was performed, and patient medications and                            allergies were reviewed. The patient's tolerance of                            previous anesthesia was also reviewed. The risks                            and benefits of the procedure and the sedation                            options and risks were discussed with the patient.                            All questions were answered, and informed consent                            was obtained. Prior Anticoagulants: The patient                            last took previous NSAID medication 5 days prior to                            the procedure. ASA Grade Assessment: I - A normal,  healthy patient. After reviewing the risks and                            benefits, the patient was deemed in satisfactory                            condition to undergo the procedure.                           After obtaining informed consent, the colonoscope                            was passed under direct vision. Throughout the                             procedure, the patient's blood pressure, pulse, and                            oxygen saturations were monitored continuously. The                            EC-[id number] ([id number]) scope was introduced through                            the anus and advanced to the the cecum, identified                            by appendiceal orifice and ileocecal valve. The                            colonoscopy was performed without difficulty. The                            patient tolerated the procedure well. The quality                            of the bowel preparation was good. The ileocecal                            valve, appendiceal orifice, and rectum were                            photographed. Scope In: 9:19:45 AM Scope Out: 9:40:31 AM Scope Withdrawal Time: 0 hours 9 minutes 59 seconds  Total Procedure Duration: 0 hours 20 minutes 46 seconds  Findings:      The perianal and digital rectal examinations were normal.      The colon (entire examined portion) appeared normal.      Internal hemorrhoids were found during retroflexion. The hemorrhoids       were small. Impression:               - The entire examined colon is normal.                           - Internal hemorrhoids.                           -  No specimens collected. Moderate Sedation:      Moderate (conscious) sedation was administered by the endoscopy nurse       and supervised by the endoscopist. The following parameters were       monitored: oxygen saturation, heart rate, blood pressure, CO2       capnography and response to care. Total physician intraservice time was       30 minutes. Recommendation:           - Resume previous diet today.                           - Continue present medications.                           - Repeat colonoscopy in 5 years for screening                            purposes.                           - Patient has a contact number available for                            emergencies. The  signs and symptoms of potential                            delayed complications were discussed with the                            patient. Return to normal activities tomorrow.                            Written discharge instructions were provided to the                            patient. Procedure Code(s):        --- Professional ---                           [phone], Colonoscopy, flexible; diagnostic, including                            collection of specimen(s) by brushing or washing,                            when performed (separate procedure)                           99152, Moderate sedation services provided by the                            same physician or other qualified health care                            professional performing the diagnostic or  therapeutic service that the sedation supports,                            requiring the presence of an independent trained                            observer to assist in the monitoring of the                            patient's level of consciousness and physiological                            status; initial 15 minutes of intraservice time,                            patient age 88 years or older                           [phone], Moderate sedation services; each additional                            15 minutes intraservice time Diagnosis Code(s):        --- Professional ---                           Z80.0, Family history of malignant neoplasm of                            digestive organs                           K64.8, Other hemorrhoids CPT copyright 2016 [employer]. All rights reserved. The codes documented in this report are preliminary and upon coder review may  be revised to meet current compliance requirements. Hildred Laser, MD Hildred Laser, MD 05/19/2017 9:49:27 AM This report has been signed electronically. Number of Addenda: 0

## 2017-05-19 NOTE — Progress Notes (Signed)
Telephone call to Wife Eddie Salazar at [phone] to verify of Dr. Laural Golden finished procedure. Son with patient. [name] states "I have already talked to Dr. Laural Golden."

## 2017-05-19 NOTE — Discharge Instructions (Signed)
Resume usual medications and diet. No driving for 24 hours. Next colonoscopy in 5 years.       Colonoscopy, Adult, Care After This sheet gives you information about how to care for yourself after your procedure. Your doctor may also give you more specific instructions. If you have problems or questions, call your doctor. Follow these instructions at home: General instructions   For the first 24 hours after the procedure: ? Do not drive or use machinery. ? Do not sign important documents. ? Do not drink alcohol. ? Do your daily activities more slowly than normal. ? Eat foods that are soft and easy to digest. ? Rest often.  Take over-the-counter or prescription medicines only as told by your doctor.  It is up to you to get the results of your procedure. Ask your doctor, or the department performing the procedure, when your results will be ready. To help cramping and bloating:  Try walking around.  Put heat on your belly (abdomen) as told by your doctor. Use a heat source that your doctor recommends, such as a moist heat pack or a heating pad. ? Put a towel between your skin and the heat source. ? Leave the heat on for 20-30 minutes. ? Remove the heat if your skin turns bright red. This is especially important if you cannot feel pain, heat, or cold. You can get burned. Eating and drinking  Drink enough fluid to keep your pee (urine) clear or pale yellow.  Return to your normal diet as told by your doctor. Avoid heavy or fried foods that are hard to digest.  Avoid drinking alcohol for as long as told by your doctor. Contact a doctor if:  You have blood in your poop (stool) 2-3 days after the procedure. Get help right away if:  You have more than a small amount of blood in your poop.  You see large clumps of tissue (blood clots) in your poop.  Your belly is swollen.  You feel sick to your stomach (nauseous).  You throw up (vomit).  You have a fever.  You have  belly pain that gets worse, and medicine does not help your pain. This information is not intended to replace advice given to you by your health care provider. Make sure you discuss any questions you have with your health care provider. Document Released: 05/09/2010 Document Revised: 12/30/2015 Document Reviewed: 12/30/2015 Elsevier Interactive Patient Education  2017 Elsevier Inc.     Hemorrhoids Hemorrhoids are swollen veins in and around the rectum or anus. There are two types of hemorrhoids:  Internal hemorrhoids. These occur in the veins that are just inside the rectum. They may poke through to the outside and become irritated and painful.  External hemorrhoids. These occur in the veins that are outside of the anus and can be felt as a painful swelling or hard lump near the anus.  Most hemorrhoids do not cause serious problems, and they can be managed with home treatments such as diet and lifestyle changes. If home treatments do not help your symptoms, procedures can be done to shrink or remove the hemorrhoids. What are the causes? This condition is caused by increased pressure in the anal area. This pressure may result from various things, including:  Constipation.  Straining to have a bowel movement.  Diarrhea.  Pregnancy.  Obesity.  Sitting for long periods of time.  Heavy lifting or other activity that causes you to strain.  Anal sex.  What are the signs or  symptoms? Symptoms of this condition include:  Pain.  Anal itching or irritation.  Rectal bleeding.  Leakage of stool (feces).  Anal swelling.  One or more lumps around the anus.  How is this diagnosed? This condition can often be diagnosed through a visual exam. Other exams or tests may also be done, such as:  Examination of the rectal area with a gloved hand (digital rectal exam).  Examination of the anal canal using a small tube (anoscope).  A blood test, if you have lost a significant amount  of blood.  A test to look inside the colon (sigmoidoscopy or colonoscopy).  How is this treated? This condition can usually be treated at home. However, various procedures may be done if dietary changes, lifestyle changes, and other home treatments do not help your symptoms. These procedures can help make the hemorrhoids smaller or remove them completely. Some of these procedures involve surgery, and others do not. Common procedures include:  Rubber band ligation. Rubber bands are placed at the base of the hemorrhoids to cut off the blood supply to them.  Sclerotherapy. Medicine is injected into the hemorrhoids to shrink them.  Infrared coagulation. A type of light energy is used to get rid of the hemorrhoids.  Hemorrhoidectomy surgery. The hemorrhoids are surgically removed, and the veins that supply them are tied off.  Stapled hemorrhoidopexy surgery. A circular stapling device is used to remove the hemorrhoids and use staples to cut off the blood supply to them.  Follow these instructions at home: Eating and drinking  Eat foods that have a lot of fiber in them, such as whole grains, beans, nuts, fruits, and vegetables. Ask your health care provider about taking products that have added fiber (fiber supplements).  Drink enough fluid to keep your urine clear or pale yellow. Managing pain and swelling  Take warm sitz baths for 20 minutes, 3-4 times a day to ease pain and discomfort.  If directed, apply ice to the affected area. Using ice packs between sitz baths may be helpful. ? Put ice in a plastic bag. ? Place a towel between your skin and the bag. ? Leave the ice on for 20 minutes, 2-3 times a day. General instructions  Take over-the-counter and prescription medicines only as told by your health care provider.  Use medicated creams or suppositories as told.  Exercise regularly.  Go to the bathroom when you have the urge to have a bowel movement. Do not wait.  Avoid  straining to have bowel movements.  Keep the anal area dry and clean. Use wet toilet paper or moist towelettes after a bowel movement.  Do not sit on the toilet for long periods of time. This increases blood pooling and pain. Contact a health care provider if:  You have increasing pain and swelling that are not controlled by treatment or medicine.  You have uncontrolled bleeding.  You have difficulty having a bowel movement, or you are unable to have a bowel movement.  You have pain or inflammation outside the area of the hemorrhoids. This information is not intended to replace advice given to you by your health care provider. Make sure you discuss any questions you have with your health care provider. Document Released: 04/03/2000 Document Revised: 09/04/2015 Document Reviewed: 12/19/2014 Elsevier Interactive Patient Education  [employer].

## 2017-05-21 ENCOUNTER — Encounter (HOSPITAL_COMMUNITY): Payer: Self-pay | Admitting: Internal Medicine

## 2017-07-26 DIAGNOSIS — M1990 Unspecified osteoarthritis, unspecified site: Secondary | ICD-10-CM | POA: Diagnosis not present

## 2017-07-26 DIAGNOSIS — I10 Essential (primary) hypertension: Secondary | ICD-10-CM | POA: Diagnosis not present

## 2017-07-26 DIAGNOSIS — M545 Low back pain: Secondary | ICD-10-CM | POA: Diagnosis not present

## 2017-07-26 DIAGNOSIS — J309 Allergic rhinitis, unspecified: Secondary | ICD-10-CM | POA: Diagnosis not present

## 2017-10-09 DIAGNOSIS — R21 Rash and other nonspecific skin eruption: Secondary | ICD-10-CM | POA: Diagnosis not present

## 2017-10-09 DIAGNOSIS — L509 Urticaria, unspecified: Secondary | ICD-10-CM | POA: Diagnosis not present

## 2017-10-09 DIAGNOSIS — L500 Allergic urticaria: Secondary | ICD-10-CM | POA: Diagnosis not present

## 2017-10-09 DIAGNOSIS — Z6825 Body mass index (BMI) 25.0-25.9, adult: Secondary | ICD-10-CM | POA: Diagnosis not present

## 2017-10-12 DIAGNOSIS — Z91018 Allergy to other foods: Secondary | ICD-10-CM | POA: Diagnosis not present

## 2017-10-12 DIAGNOSIS — E7409 Other glycogen storage disease: Secondary | ICD-10-CM | POA: Diagnosis not present

## 2018-01-22 LAB — CBC AND DIFFERENTIAL: WBC: 8.6

## 2018-01-25 DIAGNOSIS — Z0000 Encounter for general adult medical examination without abnormal findings: Secondary | ICD-10-CM | POA: Diagnosis not present

## 2018-02-01 DIAGNOSIS — R0789 Other chest pain: Secondary | ICD-10-CM | POA: Diagnosis not present

## 2018-02-01 DIAGNOSIS — Z23 Encounter for immunization: Secondary | ICD-10-CM | POA: Diagnosis not present

## 2018-02-01 DIAGNOSIS — I10 Essential (primary) hypertension: Secondary | ICD-10-CM | POA: Diagnosis not present

## 2018-02-01 DIAGNOSIS — Z125 Encounter for screening for malignant neoplasm of prostate: Secondary | ICD-10-CM | POA: Diagnosis not present

## 2018-02-01 DIAGNOSIS — M25529 Pain in unspecified elbow: Secondary | ICD-10-CM | POA: Diagnosis not present

## 2018-02-01 LAB — BASIC METABOLIC PANEL
BUN: 12 (ref 4–21)
Chloride: 104 (ref 99–108)
Creatinine: 0.9 (ref <=1.3)
Glucose: 93
Potassium: 4.6 (ref 3.4–5.3)
Sodium: 140 (ref 137–147)

## 2018-02-01 LAB — PSA: PSA: 0.7

## 2018-02-01 LAB — CBC AND DIFFERENTIAL
HCT: 46 (ref 41–53)
Hemoglobin: 15 (ref 13.5–17.5)
Neutrophils Absolute: 4799
Platelets: 397 (ref 150–399)

## 2018-02-01 LAB — CBC: RBC: 5.55 — AB (ref 3.87–5.11)

## 2018-02-01 LAB — LIPID PANEL
Cholesterol: 210 — AB (ref 0–200)
HDL: 38 (ref 35–70)
LDL Cholesterol: 129
Triglycerides: 301 — AB (ref 40–160)

## 2018-02-01 LAB — HEPATIC FUNCTION PANEL
ALT: 17 (ref 10–40)
AST: 18 (ref 14–40)

## 2018-02-01 LAB — COMPREHENSIVE METABOLIC PANEL
Albumin: 4.1 (ref 3.5–5.0)
Calcium: 9.4 (ref 8.7–10.7)
Globulin: 2.8

## 2018-02-01 LAB — TSH: TSH: 2.26 (ref <=5.90)

## 2018-09-28 ENCOUNTER — Telehealth: Payer: Self-pay | Admitting: *Deleted

## 2018-09-28 ENCOUNTER — Other Ambulatory Visit: Payer: Self-pay

## 2018-09-28 DIAGNOSIS — Z20822 Contact with and (suspected) exposure to covid-19: Secondary | ICD-10-CM

## 2018-09-28 NOTE — Telephone Encounter (Signed)
Dr. Luan Pulling called to refer this patient and wife for the covid-19 test.  Pt and wife notified and scheduled for the test in [place] at the [facility] at 10:45 this morning. Advised to wear a mask, stay in car with windows up until ready for testing. Verbal understanding noted.

## 2018-10-04 LAB — NOVEL CORONAVIRUS, NAA: SARS-CoV-2, NAA: NOT DETECTED

## 2018-12-22 DIAGNOSIS — Z0000 Encounter for general adult medical examination without abnormal findings: Secondary | ICD-10-CM | POA: Diagnosis not present

## 2018-12-27 DIAGNOSIS — E785 Hyperlipidemia, unspecified: Secondary | ICD-10-CM | POA: Diagnosis not present

## 2018-12-27 DIAGNOSIS — Z125 Encounter for screening for malignant neoplasm of prostate: Secondary | ICD-10-CM | POA: Diagnosis not present

## 2018-12-27 DIAGNOSIS — I10 Essential (primary) hypertension: Secondary | ICD-10-CM | POA: Diagnosis not present

## 2018-12-27 DIAGNOSIS — Z0000 Encounter for general adult medical examination without abnormal findings: Secondary | ICD-10-CM | POA: Diagnosis not present

## 2019-01-16 DIAGNOSIS — Z1212 Encounter for screening for malignant neoplasm of rectum: Secondary | ICD-10-CM | POA: Diagnosis not present

## 2019-01-16 DIAGNOSIS — Z1211 Encounter for screening for malignant neoplasm of colon: Secondary | ICD-10-CM | POA: Diagnosis not present

## 2019-02-23 ENCOUNTER — Ambulatory Visit: Payer: BC Managed Care – PPO | Admitting: Family Medicine

## 2019-02-23 ENCOUNTER — Encounter: Payer: Self-pay | Admitting: Family Medicine

## 2019-02-23 ENCOUNTER — Other Ambulatory Visit: Payer: Self-pay

## 2019-02-23 VITALS — BP 130/86 | HR 74 | Temp 98.2°F | Ht 70.0 in | Wt 180.6 lb

## 2019-02-23 DIAGNOSIS — Z23 Encounter for immunization: Secondary | ICD-10-CM | POA: Diagnosis not present

## 2019-02-23 DIAGNOSIS — I10 Essential (primary) hypertension: Secondary | ICD-10-CM | POA: Diagnosis not present

## 2019-02-23 DIAGNOSIS — L819 Disorder of pigmentation, unspecified: Secondary | ICD-10-CM | POA: Diagnosis not present

## 2019-02-23 DIAGNOSIS — E785 Hyperlipidemia, unspecified: Secondary | ICD-10-CM | POA: Diagnosis not present

## 2019-02-23 LAB — POCT UA - MICROALBUMIN
A1c: <30
Creatinine, POC: 100 mg/dL
Microalbumin Ur, POC: 10 mg/L

## 2019-02-23 NOTE — Patient Instructions (Signed)
Derm referral labwork-fasting in Jan

## 2019-02-23 NOTE — Progress Notes (Signed)
New Patient Office Visit  Subjective:  Patient ID: Eddie Salazar, male    DOB: 1960-06-10  Age: 58 y.o. MRN: [id number]  CC:  Chief Complaint  Patient presents with  . Establish Care  HTN/Hyperlipidemia-chronic disease management-no recent changes  HPI Eddie Salazar presents for  HTN-Telmisartan Hyperlipidemia-Rosuvastatin Tic bites-elevated levels per pt in the past -works outside Concern for pigmented lesions that may need removal-needs derm referral  Past Medical History:  Diagnosis Date  . Hypertension   . Kidney stones     Past Surgical History:  Procedure Laterality Date  . COLONOSCOPY  01/13/2012   Procedure: COLONOSCOPY;  Surgeon: Rogene Houston, MD;  [facility];  Service: Endoscopy;  Laterality: N/A;  830  . COLONOSCOPY N/A 05/19/2017   Procedure: COLONOSCOPY;  Surgeon: Rogene Houston, MD;  Location: [facility];  Service: Endoscopy;  Laterality: N/A;  915  . DISTAL BICEPS TENDON REPAIR  02/12/2012   Procedure: DISTAL BICEPS TENDON REPAIR;  Surgeon: Carole Civil, MD;  Location: [facility];  Service: Orthopedics;  Laterality: Left;  . FOOT SURGERY    . HERNIA REPAIR    . KNEE ARTHROSCOPY Right   . NECK SURGERY    . ROTATOR CUFF REPAIR      Family History  Problem Relation Age of Onset  . Dementia Mother   . Cancer Other     Social History  Lives with wife Socioeconomic History  . Marital status: Married    Spouse name: Not on file  . Number of children: Not on file  . Years of education: Not on file  . Highest education level: Not on file  Occupational History  . Occupation: Doctor, hospital: [employer]  Social Needs  . Financial resource strain: Not on file  . Food insecurity    Worry: Not on file    Inability: Not on file  . Transportation needs    Medical: Not on file    Non-medical: Not on file  Tobacco Use  . Smoking status: Never Smoker  . Smokeless tobacco: Never Used  Substance and Sexual  Activity  . Alcohol use: No  . Drug use: No  . Sexual activity: Yes  Lifestyle  . Physical activity    Days per week: Not on file    Minutes per session: Not on file  . Stress: Not on file  Relationships  . Social Herbalist on phone: Not on file    Gets together: Not on file    Attends religious service: Not on file    Active member of club or organization: Not on file    Attends meetings of clubs or organizations: Not on file    Relationship status: Not on file  . Intimate partner violence    Fear of current or ex partner: Not on file    Emotionally abused: Not on file    Physically abused: Not on file    Forced sexual activity: Not on file  Other Topics Concern  . Not on file  Social History Narrative  . Not on file    ROS Review of Systems  HENT: Negative.   Eyes:       Glasses  Respiratory: Negative.   Cardiovascular: Negative.   Musculoskeletal: Positive for arthralgias.  Skin:       Forearm-right 1.5cm raised with eryth Forearm-left 1.0cm raised with erith, crusty  Allergic/Immunologic: Positive for food allergies.  Neurological: Negative.  Hematological: Negative.   Psychiatric/Behavioral: Negative.     Objective:   Today's Vitals: BP 130/86 (BP Location: Left Arm, Patient Position: Sitting, Cuff Size: Normal)   Pulse 74   Temp 98.2 F (36.8 C) (Oral)   Ht 5\' 10"  (1.778 m)   Wt 180 lb 9.6 oz (81.9 kg)   SpO2 96%   BMI 25.91 kg/m   Physical Exam Constitutional:      Appearance: Normal appearance. He is normal weight.  HENT:     Head: Normocephalic.     Right Ear: Tympanic membrane normal.     Left Ear: Tympanic membrane normal.     Nose: Nose normal.     Mouth/Throat:     Mouth: Mucous membranes are moist.  Eyes:     Conjunctiva/sclera: Conjunctivae normal.  Neck:     Musculoskeletal: Normal range of motion and neck supple.  Cardiovascular:     Rate and Rhythm: Normal rate and regular rhythm.     Pulses: Normal pulses.   Pulmonary:     Effort: Pulmonary effort is normal.     Breath sounds: Normal breath sounds.  Abdominal:     General: Bowel sounds are normal.     Palpations: Abdomen is soft.  Musculoskeletal: Normal range of motion.  Skin:    Comments: Lesion forearm-hyperpigmented  Neurological:     Mental Status: He is alert.  Psychiatric:        Mood and Affect: Mood normal.        Behavior: Behavior normal.     Assessment & Plan:  1. Hyperpigmented skin lesion Derm refer  2. Hyperlipidemia, unspecified hyperlipidemia type crestor-lipid panel  3. Hypertension, unspecified type micardis-amp, u/a-needs ecg  Outpatient Encounter Medications as of 02/23/2019  Medication Sig  . rosuvastatin (CRESTOR) 10 MG tablet Take 10 mg by mouth daily.  Marland Kitchen telmisartan (MICARDIS) 80 MG tablet Take 80 mg by mouth daily.  . [DISCONTINUED] losartan (COZAAR) 100 MG tablet Take 100 mg by mouth daily.  . [DISCONTINUED] meloxicam (MOBIC) 15 MG tablet Take 15 mg by mouth daily as needed for pain.    No facility-administered encounter medications on file as of 02/23/2019.     Follow-up: 6 months -f/u  [place] Hannah Beat, MD

## 2019-04-18 ENCOUNTER — Telehealth: Payer: Self-pay | Admitting: Family Medicine

## 2019-04-18 NOTE — Telephone Encounter (Signed)
Routing to Dr. Corum for advice ? 

## 2019-04-18 NOTE — Telephone Encounter (Signed)
Testing 5-7 days post exposure Quarantine for 10 days post exposure if NO SYMPTOMS To schedule a test call [phone] or text COVID to [phone] or log on to [email or web address]

## 2019-04-18 NOTE — Telephone Encounter (Signed)
Eddie Salazar and Wife Eddie Salazar are both aware

## 2019-04-18 NOTE — Telephone Encounter (Signed)
Patient is calling and states both of his kids tested positive for covid and they were at his house on christmas day for a short period of time but did wear mask. He states he has no symptoms besides a little stuffy ness but would like to know if Dr. Holly Salazar will want him to go tested.

## 2019-04-19 ENCOUNTER — Other Ambulatory Visit: Payer: Self-pay

## 2019-04-19 ENCOUNTER — Ambulatory Visit: Payer: BC Managed Care – PPO | Attending: Internal Medicine

## 2019-04-19 DIAGNOSIS — Z20822 Contact with and (suspected) exposure to covid-19: Secondary | ICD-10-CM

## 2019-04-19 DIAGNOSIS — Z20828 Contact with and (suspected) exposure to other viral communicable diseases: Secondary | ICD-10-CM | POA: Insufficient documentation

## 2019-04-20 LAB — COMPLETE METABOLIC PANEL WITH GFR
AG Ratio: 1.5 (calc) (ref 1.0–2.5)
ALT: 19 U/L (ref 9–46)
AST: 18 U/L (ref 10–35)
Albumin: 4.3 g/dL (ref 3.6–5.1)
Alkaline phosphatase (APISO): 71 U/L (ref 35–144)
BUN: 13 mg/dL (ref 7–25)
CO2: 26 mmol/L (ref 20–32)
Calcium: 9.3 mg/dL (ref 8.6–10.3)
Chloride: 105 mmol/L (ref 98–110)
Creat: 0.99 mg/dL (ref 0.70–1.33)
GFR, Est African American: 98 mL/min/{1.73_m2} (ref >=60)
GFR, Est Non African American: 84 mL/min/{1.73_m2} (ref >=60)
Globulin: 2.8 g/dL (calc) (ref 1.9–3.7)
Glucose, Bld: 99 mg/dL (ref 65–99)
Potassium: 4.2 mmol/L (ref 3.5–5.3)
Sodium: 141 mmol/L (ref 135–146)
Total Bilirubin: 0.9 mg/dL (ref 0.2–1.2)
Total Protein: 7.1 g/dL (ref 6.1–8.1)

## 2019-04-20 LAB — LIPID PANEL
Cholesterol: 164 mg/dL (ref <200)
HDL: 44 mg/dL (ref >=40)
LDL Cholesterol (Calc): 86 mg/dL (calc)
Non-HDL Cholesterol (Calc): 120 mg/dL (calc) (ref <130)
Total CHOL/HDL Ratio: 3.7 (calc) (ref <5.0)
Triglycerides: 262 mg/dL — ABNORMAL HIGH (ref <150)

## 2019-04-20 LAB — NOVEL CORONAVIRUS, NAA: SARS-CoV-2, NAA: NOT DETECTED

## 2019-08-23 ENCOUNTER — Ambulatory Visit: Payer: BC Managed Care – PPO | Admitting: Family Medicine

## 2019-10-18 ENCOUNTER — Telehealth (INDEPENDENT_AMBULATORY_CARE_PROVIDER_SITE_OTHER): Payer: Self-pay | Admitting: *Deleted

## 2019-10-18 NOTE — Telephone Encounter (Signed)
Agree he needs stool studies for evaluation and labs - can he come this afternoon for an appointment??

## 2019-10-18 NOTE — Telephone Encounter (Signed)
Patient's wife [name] , who is a Marine scientist, has contacted me to say that her Father got sick around the same time patient did , her Father got out of the hospital 10/13/19. He had C- Diff. The last time that patient was at their house was 10/01/2019.  She also states that the first night that the patient was sick , he was  Up every 20 -30 minutes. But not so much now.

## 2019-10-18 NOTE — Telephone Encounter (Signed)
Patient states that about 1 month ago he begun to have issues with his throat.This was with his salvia. When he swallowed it felt like there was a speed bump to go down. Then , it felt as if the speed bump felt like a square and the salvia would hit it then go over (able to swallow).  Patient is positive for Alpha Gal . He went to see his PCP who treated him for reflux. He was treated with 2 weeks of a PPI. This issue got better and remain okay. He did stop the PPI.  About 2 Saturdays ago, he could not keep anything down. He would eat and have to go the bathroom with diarrhea. Denies vomiting.  He works at [employer] and there had been Norovirus there and staff did get this and all who did are all better now. He states that he has not.   Eddie Salazar has no appetite. Can eat 1 chicken nugget and be completley full. He has bloating , and stomach feels like it is boiling, make awful loud sounds.  The stool does have a foul odor, he has lost 15 lbs , and has regained 5 lbs back. He may have 1 normal stool , followed by the next 3 days being diarrhea.  He (wife) are also under a lot of stress due to the illnesses  of both is mother and father in law.  Last TCS was in 2019. Patient is trying to stay hydrated.

## 2019-10-19 ENCOUNTER — Other Ambulatory Visit: Payer: Self-pay

## 2019-10-19 ENCOUNTER — Ambulatory Visit (INDEPENDENT_AMBULATORY_CARE_PROVIDER_SITE_OTHER): Payer: 59 | Admitting: Gastroenterology

## 2019-10-19 ENCOUNTER — Encounter (INDEPENDENT_AMBULATORY_CARE_PROVIDER_SITE_OTHER): Payer: Self-pay | Admitting: Gastroenterology

## 2019-10-19 VITALS — BP 127/71 | HR 116 | Temp 99.2°F | Ht 70.0 in | Wt 173.8 lb

## 2019-10-19 DIAGNOSIS — R197 Diarrhea, unspecified: Secondary | ICD-10-CM

## 2019-10-19 MED ORDER — DICYCLOMINE HCL 10 MG PO CAPS
10.0000 mg | ORAL_CAPSULE | Freq: Three times a day (TID) | ORAL | 1 refills | Status: DC | PRN
Start: 2019-10-19 — End: 2022-03-19

## 2019-10-19 NOTE — Patient Instructions (Signed)
We are checking stool studies and labs. Can try dicyclomine as needed for diarrhea.

## 2019-10-19 NOTE — Progress Notes (Signed)
Patient profile: Eddie Salazar is a 59 y.o. male seen for evaluation of colon cancer screening in 2019.  Self-referred today to discuss acute GI issues.    History of Present Illness: Eddie Salazar is seen today for acute concerns beginning 4 weeks ago developed dysphagia, felt like "speed bump" in esophagus/throat, occurred when swallowing saliva, didn't feel that he had any issues swallowing foods or pills, only felt "speed bump" with saliva.  Went to PCP and diagnosed w/ acid reflux and tried protonix once a day. Didn't notice any improvement until end of two week course but now the sensation of a speed bump in his esophagus has resolved.  He is not having esophageal burning. Sleeps w/ HOB elevated chronically given his wife has GERD. No nausea/vomiting except for 1 day with below.   Beginning two weeks ago developed diarrhea, nausea. Concerned these were SE of the protonix and stopped protonix.  Reports 1 day of severe symptoms with multiple episodes of diarrhea and nausea.  Repots continued diarrhea x 2 weeks but currently less frequent than was. Now having 2 loose BM/day. Associated w/ poor appetite still, improved compared to onset of symptoms but still decreased.  Reports he feels full after a lean cuisine or sandwich which would normally would not fill him up. Also has a lot of abd cramping and growling, worse at night or after his evening meal since the diarrhea began.Marland Kitchen Has tried imodium without big improvement. States appetite is definitively better than when diarrhea first started and has regained some of the weight he lost initially. Avoiding greasy foods currently due to diarrhea.   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   Wt Readings from Last 3 Encounters:  10/19/19 173 lb 12.8 oz (78.8 kg)  02/23/19 180 lb 9.6 oz (81.9 kg)  05/19/17 175 lb (79.4 kg)     Last Colonoscopy: 04/2017-normal-done for history of colon cancer in his father. Last Endoscopy: none prior     Past Medical History:  Past Medical History:  Diagnosis Date   Hypertension    Kidney stones     Problem List: Patient Active Problem List   Diagnosis Date Noted   Hyperpigmented skin lesion 02/23/2019   Hyperlipidemia 02/23/2019   Hypertension 02/23/2019   Family history of colon cancer 02/16/2017   History of colonic polyps 02/16/2017   Osteoarthritis of left knee 04/27/2013   Biceps tendon tear 02/01/2012   LOOSE BODY-KNEE 06/25/2009   TEAR MEDIAL MENISCUS 06/25/2009   TEAR M C L 06/25/2009   KNEE, ARTHRITIS, DEGEN./OSTEO 06/18/2009   DERANGEMENT MENISCUS 06/18/2009    Past Surgical History: Past Surgical History:  Procedure Laterality Date   COLONOSCOPY  01/13/2012   Procedure: COLONOSCOPY;  Surgeon: Rogene Houston, MD;  [facility];  Service: Endoscopy;  Laterality: N/A;  830   COLONOSCOPY N/A 05/19/2017   Procedure: COLONOSCOPY;  Surgeon: Rogene Houston, MD;  [facility];  Service: Endoscopy;  Laterality: N/A;  [place] TENDON REPAIR  02/12/2012   Procedure: DISTAL BICEPS TENDON REPAIR;  Surgeon: Carole Civil, MD;  Location: [facility];  Service: Orthopedics;  Laterality: Left;   FOOT SURGERY     HERNIA REPAIR     KNEE ARTHROSCOPY Right    NECK SURGERY     ROTATOR CUFF REPAIR      Allergies: Allergies  Allergen Reactions   Beef-Derived Products Other (See Comments)    Due to tick bite, can eat  with Benadryl       Home Medications:  Current Outpatient Medications:    telmisartan (MICARDIS) 80 MG tablet, Take 80 mg by mouth daily., Disp: , Rfl:    dicyclomine (BENTYL) 10 MG capsule, Take 1 capsule (10 mg total) by mouth 3 (three) times daily as needed (diarrhea abd pain)., Disp: 45 capsule, Rfl: 1   Family History: family history includes Cancer in an other family member; Dementia in his mother.    Social History:   reports that he has never smoked. He has never used smokeless tobacco. He  reports that he does not drink alcohol and does not use drugs.   Review of Systems: Constitutional: Denies weight loss/weight gain  Eyes: No changes in vision. ENT: No oral lesions, sore throat.  GI: see HPI.  Heme/Lymph: No easy bruising.  CV: No chest pain.  GU: No hematuria.  Integumentary: No rashes.  Neuro: No headaches.  Psych: No depression/anxiety.  Endocrine: No heat/cold intolerance.  Allergic/Immunologic: No urticaria.  Resp: No cough, SOB.  Musculoskeletal: No joint swelling.    Physical Examination: BP 127/71 (BP Location: Right Arm, Patient Position: Sitting, Cuff Size: Large)    Pulse (!) 116    Temp 99.2 F (37.3 C) (Oral)    Ht 5\' 10"  (1.778 m)    Wt 173 lb 12.8 oz (78.8 kg)    BMI 24.94 kg/m  Gen: NAD, alert and oriented x 4 HEENT: PEERLA, EOMI, Neck: supple, no JVD Chest: CTA bilaterally, no wheezes, crackles, or other adventitious sounds CV: RRR, no m/g/c/r Abd: soft, NT, ND, +BS in all four quadrants; no HSM, guarding, ridigity, or rebound tenderness Ext: no edema, well perfused with 2+ pulses, Skin: no rash or lesions noted on observed skin Lymph: no noted LAD  Data Reviewed:   Last labs in epic were 04/09/2019  Assessment/Plan: Eddie Salazar is a 59 y.o. male seen for evaluation of 2 weeks of diarrhea, and nausea, decreased appetite.  He does work in a nursing home and symptoms seem somewhat concerning for infectious etiology.  We will check stool studies and labs.  If symptoms continue would recommend colonoscopy but a normal colonoscopy in 2019.  Given symptoms have persisted less likely side effect of Protonix, see below.  We will give him dicyclomine to use as needed for symptoms as needed while work-up is pending.  He will continue a bland diet.  2.  GERD/dysphagia-resolved with a 1 to 2-week course of Protonix before developing above symptoms which were initially thought to be possible side effect and stopped Protonix.  He currently has no upper GI  symptoms so we will hold off on restarting.   Eddie Salazar was seen today for follow-up.  Diagnoses and all orders for this visit:  Diarrhea of presumed infectious origin -     C. difficile GDH and Toxin A/B -     Gastrointestinal Pathogen Panel PCR -     CBC with Differential -     COMPLETE METABOLIC PANEL WITH GFR  Other orders -     dicyclomine (BENTYL) 10 MG capsule; Take 1 capsule (10 mg total) by mouth 3 (three) times daily as needed (diarrhea abd pain).        I personally performed the service, non-incident to. (WP)  Laurine Blazer, [facility]

## 2019-10-20 LAB — CBC WITH DIFFERENTIAL/PLATELET
Absolute Monocytes: 861 cells/uL (ref 200–950)
Basophils Absolute: 74 cells/uL (ref 0–200)
Basophils Relative: 0.7 %
Eosinophils Absolute: 273 cells/uL (ref 15–500)
Eosinophils Relative: 2.6 %
HCT: 44.8 % (ref 38.5–50.0)
Hemoglobin: 14.6 g/dL (ref 13.2–17.1)
Lymphs Abs: 2940 cells/uL (ref 850–3900)
MCH: 27.1 pg (ref 27.0–33.0)
MCHC: 32.6 g/dL (ref 32.0–36.0)
MCV: 83.1 fL (ref 80.0–100.0)
MPV: 10.8 fL (ref 7.5–12.5)
Monocytes Relative: 8.2 %
Neutro Abs: 6353 cells/uL (ref 1500–7800)
Neutrophils Relative %: 60.5 %
Platelets: 422 10*3/uL — ABNORMAL HIGH (ref 140–400)
RBC: 5.39 10*6/uL (ref 4.20–5.80)
RDW: 13.8 % (ref 11.0–15.0)
Total Lymphocyte: 28 %
WBC: 10.5 10*3/uL (ref 3.8–10.8)

## 2019-10-20 LAB — COMPLETE METABOLIC PANEL WITH GFR
AG Ratio: 1.6 (calc) (ref 1.0–2.5)
ALT: 23 U/L (ref 9–46)
AST: 19 U/L (ref 10–35)
Albumin: 3.9 g/dL (ref 3.6–5.1)
Alkaline phosphatase (APISO): 65 U/L (ref 35–144)
BUN: 12 mg/dL (ref 7–25)
CO2: 27 mmol/L (ref 20–32)
Calcium: 9 mg/dL (ref 8.6–10.3)
Chloride: 106 mmol/L (ref 98–110)
Creat: 0.78 mg/dL (ref 0.70–1.33)
GFR, Est African American: 116 mL/min/{1.73_m2} (ref >=60)
GFR, Est Non African American: 100 mL/min/{1.73_m2} (ref >=60)
Globulin: 2.4 g/dL (calc) (ref 1.9–3.7)
Glucose, Bld: 99 mg/dL (ref 65–99)
Potassium: 4.1 mmol/L (ref 3.5–5.3)
Sodium: 140 mmol/L (ref 135–146)
Total Bilirubin: 1.3 mg/dL — ABNORMAL HIGH (ref 0.2–1.2)
Total Protein: 6.3 g/dL (ref 6.1–8.1)

## 2019-10-23 ENCOUNTER — Emergency Department (HOSPITAL_COMMUNITY): Payer: 59

## 2019-10-23 ENCOUNTER — Encounter (HOSPITAL_COMMUNITY): Payer: Self-pay

## 2019-10-23 ENCOUNTER — Other Ambulatory Visit: Payer: Self-pay

## 2019-10-23 ENCOUNTER — Emergency Department (HOSPITAL_COMMUNITY)
Admission: EM | Admit: 2019-10-23 | Discharge: 2019-10-23 | Disposition: A | Discharge status: Home / Self Care | Payer: 59 | Attending: Emergency Medicine | Admitting: Emergency Medicine

## 2019-10-23 DIAGNOSIS — I10 Essential (primary) hypertension: Secondary | ICD-10-CM | POA: Insufficient documentation

## 2019-10-23 DIAGNOSIS — R197 Diarrhea, unspecified: Secondary | ICD-10-CM | POA: Insufficient documentation

## 2019-10-23 DIAGNOSIS — Z79899 Other long term (current) drug therapy: Secondary | ICD-10-CM | POA: Insufficient documentation

## 2019-10-23 DIAGNOSIS — E756 Lipid storage disorder, unspecified: Secondary | ICD-10-CM

## 2019-10-23 DIAGNOSIS — R109 Unspecified abdominal pain: Secondary | ICD-10-CM | POA: Diagnosis not present

## 2019-10-23 DIAGNOSIS — E7429 Other disorders of galactose metabolism: Secondary | ICD-10-CM | POA: Diagnosis not present

## 2019-10-23 DIAGNOSIS — E8809 Other disorders of plasma-protein metabolism, not elsewhere classified: Secondary | ICD-10-CM

## 2019-10-23 LAB — URINALYSIS, ROUTINE W REFLEX MICROSCOPIC
Bilirubin Urine: NEGATIVE
Glucose, UA: NEGATIVE mg/dL
Hgb urine dipstick: NEGATIVE
Ketones, ur: NEGATIVE mg/dL
Leukocytes,Ua: NEGATIVE
Nitrite: NEGATIVE
Protein, ur: NEGATIVE mg/dL
Specific Gravity, Urine: >1.046 — ABNORMAL HIGH (ref 1.005–1.030)
pH: 5 (ref 5.0–8.0)

## 2019-10-23 LAB — COMPREHENSIVE METABOLIC PANEL
ALT: 22 U/L (ref 0–44)
AST: 19 U/L (ref 15–41)
Albumin: 3.3 g/dL — ABNORMAL LOW (ref 3.5–5.0)
Alkaline Phosphatase: 56 U/L (ref 38–126)
Anion gap: 10 (ref 5–15)
BUN: 11 mg/dL (ref 6–20)
CO2: 23 mmol/L (ref 22–32)
Calcium: 8.2 mg/dL — ABNORMAL LOW (ref 8.9–10.3)
Chloride: 107 mmol/L (ref 98–111)
Creatinine, Ser: 0.7 mg/dL (ref 0.61–1.24)
GFR calc Af Amer: >60 mL/min (ref >60)
GFR calc non Af Amer: >60 mL/min (ref >60)
Glucose, Bld: 102 mg/dL — ABNORMAL HIGH (ref 70–99)
Potassium: 3.4 mmol/L — ABNORMAL LOW (ref 3.5–5.1)
Sodium: 140 mmol/L (ref 135–145)
Total Bilirubin: 1.6 mg/dL — ABNORMAL HIGH (ref 0.3–1.2)
Total Protein: 6 g/dL — ABNORMAL LOW (ref 6.5–8.1)

## 2019-10-23 LAB — C DIFFICILE QUICK SCREEN W PCR REFLEX
C Diff antigen: NEGATIVE
C Diff interpretation: NOT DETECTED
C Diff toxin: NEGATIVE

## 2019-10-23 LAB — CBC WITH DIFFERENTIAL/PLATELET
Abs Immature Granulocytes: 0.01 10*3/uL (ref 0.00–0.07)
Basophils Absolute: 0.1 10*3/uL (ref 0.0–0.1)
Basophils Relative: 1 %
Eosinophils Absolute: 0.2 10*3/uL (ref 0.0–0.5)
Eosinophils Relative: 2 %
HCT: 43.5 % (ref 39.0–52.0)
Hemoglobin: 14 g/dL (ref 13.0–17.0)
Immature Granulocytes: 0 %
Lymphocytes Relative: 23 %
Lymphs Abs: 2 10*3/uL (ref 0.7–4.0)
MCH: 26.8 pg (ref 26.0–34.0)
MCHC: 32.2 g/dL (ref 30.0–36.0)
MCV: 83.2 fL (ref 80.0–100.0)
Monocytes Absolute: 0.7 10*3/uL (ref 0.1–1.0)
Monocytes Relative: 8 %
Neutro Abs: 5.7 10*3/uL (ref 1.7–7.7)
Neutrophils Relative %: 66 %
Platelets: 399 10*3/uL (ref 150–400)
RBC: 5.23 MIL/uL (ref 4.22–5.81)
RDW: 14.3 % (ref 11.5–15.5)
WBC: 8.7 10*3/uL (ref 4.0–10.5)
nRBC: 0 % (ref 0.0–0.2)

## 2019-10-23 LAB — LIPASE, BLOOD: Lipase: 25 U/L (ref 11–51)

## 2019-10-23 MED ORDER — IOHEXOL 300 MG/ML  SOLN
100.0000 mL | Freq: Once | INTRAMUSCULAR | Status: AC | PRN
  Administered 2019-10-23: 100 mL via INTRAVENOUS

## 2019-10-23 MED ORDER — SODIUM CHLORIDE 0.9 % IV BOLUS
1000.0000 mL | Freq: Once | INTRAVENOUS | Status: AC
  Administered 2019-10-23: 1000 mL via INTRAVENOUS

## 2019-10-23 NOTE — ED Triage Notes (Signed)
Pt reports that he was started on medication for reflux approx 4 weeks ago. Pt started on propantoprazole. Pt noted approx 1 1/2 week after being on medication then stopped. Pt has had diarrhea approx 4 weeks. Off and on between soft and solid. Pt reports he had been seen by Dr Shonna Chock. 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 Pt has lost 10 lbs. Reports eating shrimp salad last night and had loose stool and gas from 7pm -0330 last night

## 2019-10-23 NOTE — ED Provider Notes (Signed)
[facility] EMERGENCY DEPARTMENT Provider Note   CSN: [id number] Arrival date & time: 10/23/19  3662     History Chief Complaint  Patient presents with  . Diarrhea    Eddie Salazar is a 59 y.o. male.  Pt presents to the ED today with diarrhea.  He said it's been going on for about 4 weeks on and off.  Pt has been seen by Dr. Shonna Chock.  They ordered stool studies, but he was unable to produce one.  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  He had cramping abdominal pain and diarrhea from around 7p-3:30 am.  He is feeling a little better now.  Pt had a nl colonoscopy in 2019.  He does work as a Oncologist.        Past Medical History:  Diagnosis Date  . Hypertension   . Kidney stones     Patient Active Problem List   Diagnosis Date Noted  . Hyperpigmented skin lesion 02/23/2019  . Hyperlipidemia 02/23/2019  . Hypertension 02/23/2019  . Family history of colon cancer 02/16/2017  . History of colonic polyps 02/16/2017  . Osteoarthritis of left knee 04/27/2013  . Biceps tendon tear 02/01/2012  . LOOSE BODY-KNEE 06/25/2009  . TEAR MEDIAL MENISCUS 06/25/2009  . TEAR M C L 06/25/2009  . KNEE, ARTHRITIS, DEGEN./OSTEO 06/18/2009  . DERANGEMENT MENISCUS 06/18/2009    Past Surgical History:  Procedure Laterality Date  . COLONOSCOPY  01/13/2012   Procedure: COLONOSCOPY;  Surgeon: Rogene Houston, MD;  Location: [facility];  Service: Endoscopy;  Laterality: N/A;  830  . COLONOSCOPY N/A 05/19/2017   Procedure: COLONOSCOPY;  Surgeon: Rogene Houston, MD;  Location: [facility];  Service: Endoscopy;  Laterality: N/A;  915  . DISTAL BICEPS TENDON REPAIR  02/12/2012   Procedure: DISTAL BICEPS TENDON REPAIR;  Surgeon: Carole Civil, MD;  Location: [facility] ORS;  Service: Orthopedics;  Laterality: Left;  . FOOT SURGERY    . HERNIA REPAIR    . KNEE ARTHROSCOPY Right   . NECK SURGERY    . ROTATOR  CUFF REPAIR         Family History  Problem Relation Age of Onset  . Dementia Mother   . Cancer Other     Social History   Tobacco Use  . Smoking status: Never Smoker  . Smokeless tobacco: Never Used  Vaping Use  . Vaping Use: Never used  Substance Use Topics  . Alcohol use: No  . Drug use: No    Home Medications Prior to Admission medications   Medication Sig Start Date End Date Taking? Authorizing Provider  telmisartan (MICARDIS) 80 MG tablet Take 80 mg by mouth daily.   Yes [provider]  dicyclomine (BENTYL) 10 MG capsule Take 1 capsule (10 mg total) by mouth 3 (three) times daily as needed (diarrhea abd pain). Patient not taking: Reported on 10/23/2019 10/19/19   Laurine Blazer A, PA-C    Allergies    Beef-derived products  Review of Systems   Review of Systems  Gastrointestinal: Positive for abdominal pain and diarrhea.  All other systems reviewed and are negative.   Physical Exam Updated Vital Signs BP (!) 141/94 (BP Location: Left Arm)   Pulse 78   Temp 98.4 F (36.9 C) (Oral)   Resp 18   Ht 5\' 10"  (1.778 m)   Wt 78 kg   SpO2  98%   BMI 24.67 kg/m   Physical Exam Vitals and nursing note reviewed.  Constitutional:      Appearance: Normal appearance.  HENT:     Head: Normocephalic and atraumatic.     Right Ear: External ear normal.     Left Ear: External ear normal.     Nose: Nose normal.     Mouth/Throat:     Mouth: Mucous membranes are moist.     Pharynx: Oropharynx is clear.  Eyes:     Extraocular Movements: Extraocular movements intact.     Conjunctiva/sclera: Conjunctivae normal.     Pupils: Pupils are equal, round, and reactive to light.  Cardiovascular:     Rate and Rhythm: Normal rate and regular rhythm.     Pulses: Normal pulses.     Heart sounds: Normal heart sounds.  Pulmonary:     Effort: Pulmonary effort is normal.     Breath sounds: Normal breath sounds.  Abdominal:     General: Abdomen is flat. Bowel sounds are  normal.     Palpations: Abdomen is soft.  Musculoskeletal:        General: Normal range of motion.     Cervical back: Normal range of motion and neck supple.  Skin:    General: Skin is warm.     Capillary Refill: Capillary refill takes less than 2 seconds.  Neurological:     General: No focal deficit present.     Mental Status: He is alert and oriented to person, place, and time.  Psychiatric:        Mood and Affect: Mood normal.        Behavior: Behavior normal.        Thought Content: Thought content normal.        Judgment: Judgment normal.     ED Results / Procedures / Treatments   Labs (all labs ordered are listed, but only abnormal results are displayed) Labs Reviewed  COMPREHENSIVE METABOLIC PANEL - Abnormal; Notable for the following components:      Result Value   Potassium 3.4 (*)    Glucose, Bld 102 (*)    Calcium 8.2 (*)    Total Protein 6.0 (*)    Albumin 3.3 (*)    Total Bilirubin 1.6 (*)    All other components within normal limits  URINALYSIS, ROUTINE W REFLEX MICROSCOPIC - Abnormal; Notable for the following components:   Specific Gravity, Urine >1.046 (*)    All other components within normal limits  GASTROINTESTINAL PANEL BY PCR, STOOL (REPLACES STOOL CULTURE)  C DIFFICILE QUICK SCREEN W PCR REFLEX  CBC WITH DIFFERENTIAL/PLATELET  LIPASE, BLOOD    EKG None  Radiology CT Abdomen Pelvis W Contrast  Result Date: 10/23/2019 CLINICAL DATA:  Intermittent diarrhea for 4 weeks EXAM: CT ABDOMEN AND PELVIS WITH CONTRAST TECHNIQUE: Multidetector CT imaging of the abdomen and pelvis was performed using the standard protocol following bolus administration of intravenous contrast. CONTRAST:  140mL OMNIPAQUE IOHEXOL 300 MG/ML  SOLN COMPARISON:  02/02/2017 FINDINGS: Lower chest: Minor right base scarring as before. No acute lower chest finding. Normal heart size. No pericardial or pleural effusion. Hepatobiliary: Similar subcentimeter ill-defined hypodensities in  the posterior right hepatic dome, image 18 series 2. These remain too small to definitively characterize but stability compared to 2018 suggest a benign process. No intrahepatic biliary dilatation. Gallbladder and biliary system unremarkable. Common bile duct nondilated. Pancreas: Unremarkable. No pancreatic ductal dilatation or surrounding inflammatory changes. Spleen: Normal in size without focal abnormality.  Adrenals/Urinary Tract: Adrenal glands are unremarkable. Kidneys are normal, without renal calculi, focal lesion, or hydronephrosis. Bladder is unremarkable. Stomach/Bowel: Negative for bowel obstruction, significant dilatation, ileus, or free air. Normal appendix. No focal bowel wall thickening. No free fluid, fluid collection, hemorrhage, hematoma, abscess or ascites. Vascular/Lymphatic: Intact aorta. Negative for aneurysm or occlusive process. No retroperitoneal hemorrhage or hematoma. Mesenteric and renal vasculature appear patent. No veno-occlusive process. No adenopathy. Reproductive: Nonspecific trace pelvic free fluid. No other significant finding by CT. Other: Intact abdominal wall.  No hernia. Musculoskeletal: Degenerative changes of the spine. Multilevel facet arthropathy. IMPRESSION: No acute intra-abdominal or pelvic finding by CT.  Stable exam. Electronically Signed   By: Jerilynn Mages.  Shick M.D.   On: 10/23/2019 11:08    Procedures Procedures (including critical care time)  Medications Ordered in ED Medications  sodium chloride 0.9 % bolus 1,000 mL (0 mLs Intravenous Stopped 10/23/19 1210)  iohexol (OMNIPAQUE) 300 MG/ML solution 100 mL (100 mLs Intravenous Contrast Given 10/23/19 1015)    ED Course  I have reviewed the triage vital signs and the nursing notes.  Pertinent labs & imaging results that were available during my care of the patient were reviewed by me and considered in my medical decision making (see chart for details).    MDM Rules/Calculators/A&P                          Pt  is feeling better after fluids.  He is able to tolerate po fluids.  His work up was unremarkable.  CT neg.  He was able to give a stool sample, but it was a formed stool.  Pt to f/u with Dr. Laural Golden.  Return if worse.  Final Clinical Impression(s) / ED Diagnoses Final diagnoses:  Diarrhea, unspecified type  Alpha galactosidase deficiency    Rx / DC Orders ED Discharge Orders    None       Isla Pence, MD 10/23/19 1313

## 2019-10-24 ENCOUNTER — Other Ambulatory Visit (INDEPENDENT_AMBULATORY_CARE_PROVIDER_SITE_OTHER): Payer: Self-pay | Admitting: Gastroenterology

## 2019-10-24 DIAGNOSIS — R1310 Dysphagia, unspecified: Secondary | ICD-10-CM

## 2019-10-24 DIAGNOSIS — K219 Gastro-esophageal reflux disease without esophagitis: Secondary | ICD-10-CM

## 2019-10-24 LAB — GASTROINTESTINAL PANEL BY PCR, STOOL (REPLACES STOOL CULTURE)
Adenovirus F40/41: NOT DETECTED
Astrovirus: NOT DETECTED
Campylobacter species: NOT DETECTED
Cryptosporidium: NOT DETECTED
Cyclospora cayetanensis: DETECTED — AB
Entamoeba histolytica: NOT DETECTED
Enteroaggregative E coli (EAEC): NOT DETECTED
Enteropathogenic E coli (EPEC): DETECTED — AB
Enterotoxigenic E coli (ETEC): NOT DETECTED
Giardia lamblia: NOT DETECTED
Norovirus GI/GII: NOT DETECTED
Plesimonas shigelloides: NOT DETECTED
Rotavirus A: NOT DETECTED
Salmonella species: NOT DETECTED
Sapovirus (I, II, IV, and V): NOT DETECTED
Shiga like toxin producing E coli (STEC): NOT DETECTED
Shigella/Enteroinvasive E coli (EIEC): NOT DETECTED
Vibrio cholerae: NOT DETECTED
Vibrio species: NOT DETECTED
Yersinia enterocolitica: NOT DETECTED

## 2019-10-24 NOTE — Progress Notes (Signed)
Dr. Laural Golden recommends barium swallow with tablet.  Order placed.  Patient aware will call him.

## 2019-10-24 NOTE — Progress Notes (Signed)
Barium swallow sch'd 10/30/19 at 9 (845), npo 3 hrs prior, patient aware

## 2019-10-27 ENCOUNTER — Ambulatory Visit (HOSPITAL_COMMUNITY): Payer: 59

## 2019-10-30 ENCOUNTER — Other Ambulatory Visit: Payer: Self-pay

## 2019-10-30 ENCOUNTER — Other Ambulatory Visit (INDEPENDENT_AMBULATORY_CARE_PROVIDER_SITE_OTHER): Payer: Self-pay | Admitting: Gastroenterology

## 2019-10-30 ENCOUNTER — Ambulatory Visit (HOSPITAL_COMMUNITY)
Admission: RE | Admit: 2019-10-30 | Discharge: 2019-10-30 | Disposition: A | Discharge status: Home / Self Care | Payer: 59 | Source: Ambulatory Visit | Attending: Gastroenterology | Admitting: Gastroenterology

## 2019-10-30 DIAGNOSIS — R1310 Dysphagia, unspecified: Secondary | ICD-10-CM

## 2019-10-30 DIAGNOSIS — K219 Gastro-esophageal reflux disease without esophagitis: Secondary | ICD-10-CM

## 2020-11-26 IMAGING — RF DG ESOPHAGUS
9 series · 14 of 24 positions shown · non-contrast
Comparison: None.

CLINICAL DATA: Dysphagia.  Gastroesophageal reflux disease.

EXAM:
ESOPHOGRAM / BARIUM SWALLOW / BARIUM TABLET STUDY
TECHNIQUE: Combined double contrast and single contrast examination performed
using effervescent crystals, thick barium liquid, and thin barium
liquid. The patient was observed with fluoroscopy swallowing a 13 mm
barium sulphate tablet.
FLUOROSCOPY TIME:  Fluoroscopy Time:  2 minutes and 18 seconds
Radiation Exposure Index (if provided by the fluoroscopic device):
21.4 mGy
Number of Acquired Spot Images: 0

[Series 1: cp_standard · 0.27mm/px · 2 of 43 frames shown (1 of 9)]
[frame 7/43]
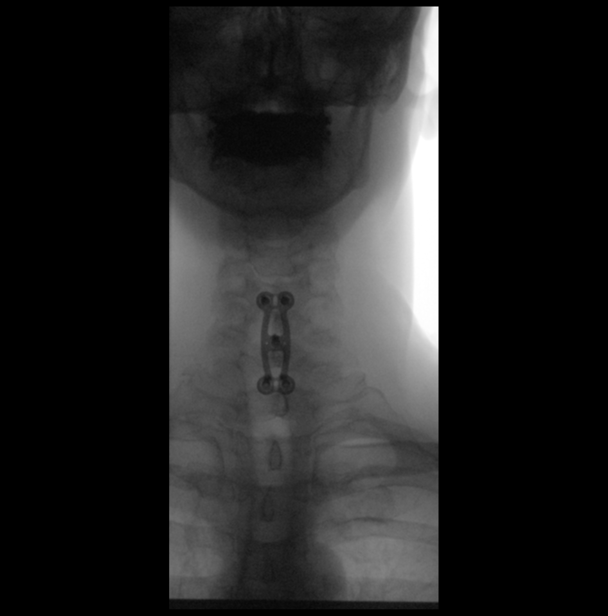
[frame 37/43]
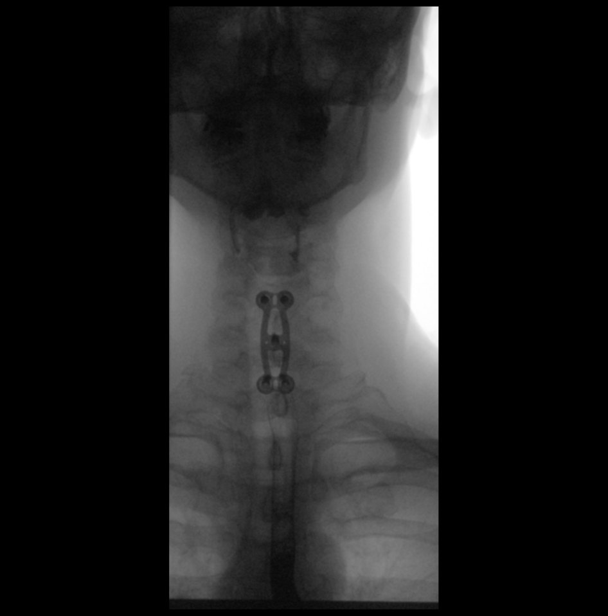

[Series 2: cp_standard · 0.18mm/px · 1 of 70 frames shown (2 of 9)]
[frame 36/70]
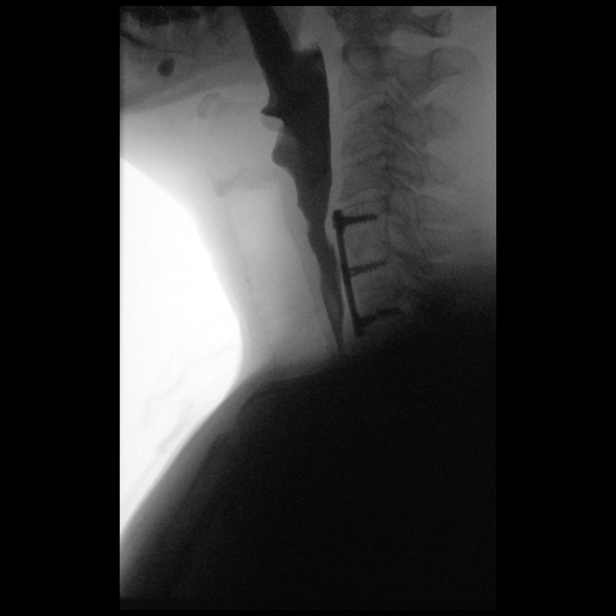

[Series 3: cp_standard · 0.26mm/px · 2 of 91 frames shown (3 of 9)]
[frame 14/91]
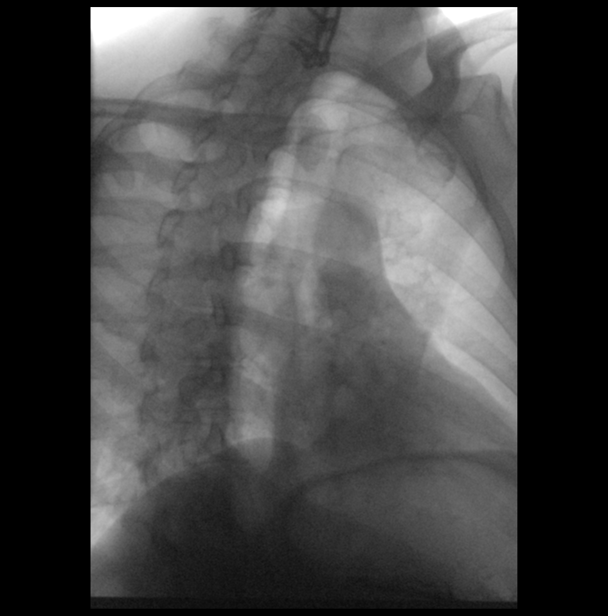
[frame 46/91]
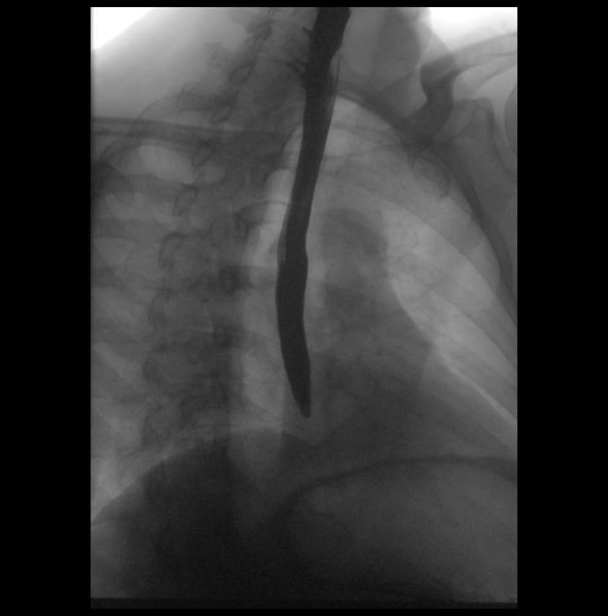

[Series 4: cp_standard · 0.27mm/px · 2 of 252 frames shown (4 of 9)]
[frame 127/252]
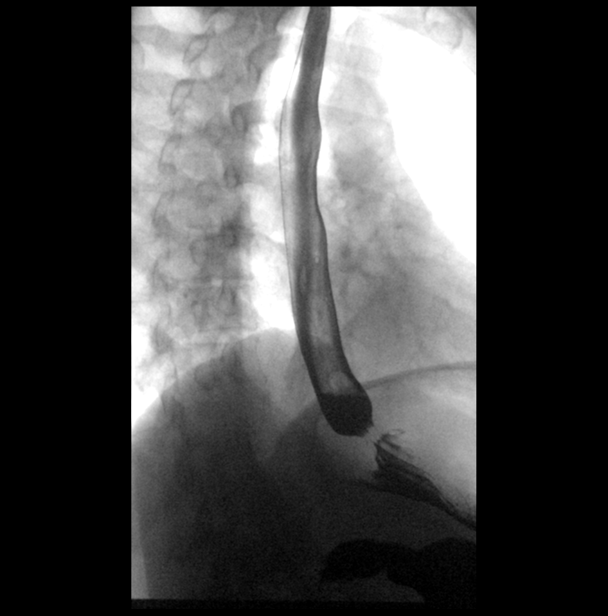
[frame 215/252]
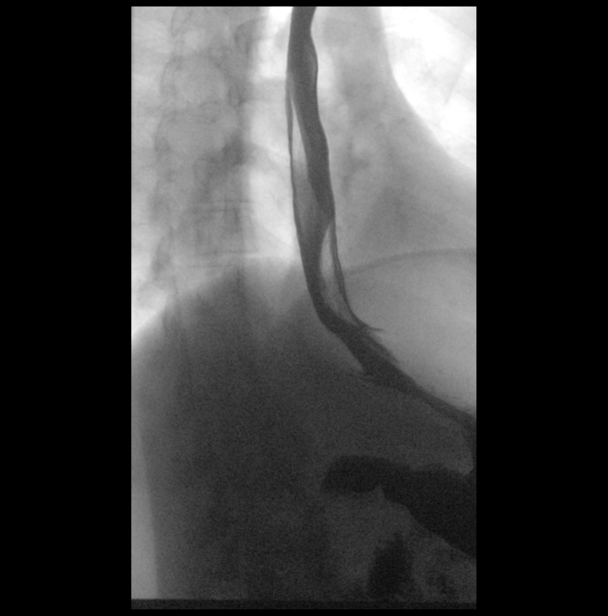

[Series 5: cp_standard · 0.29mm/px · 2 of 123 frames shown (5 of 9)]
[frame 62/123]
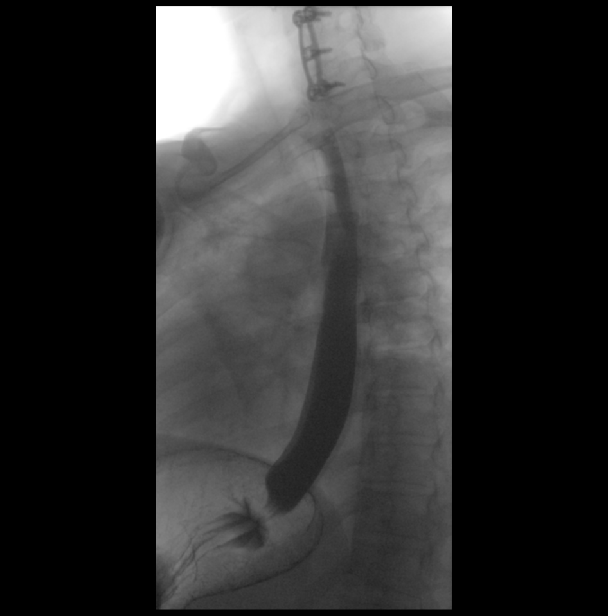
[frame 123/123]
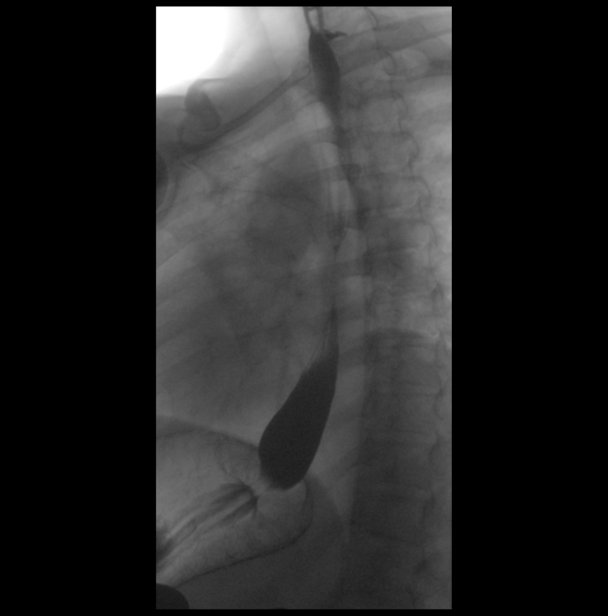

[Series 6: cp_standard · 0.28mm/px · 1 of 159 frames shown (6 of 9)]
[frame 136/159]
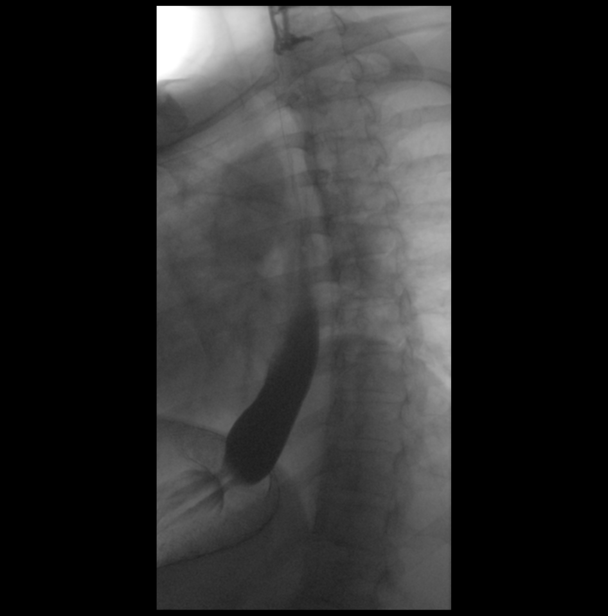

[Series 7: cp_standard · 0.28mm/px · 2 of 180 frames shown (7 of 9)]
[frame 91/180]
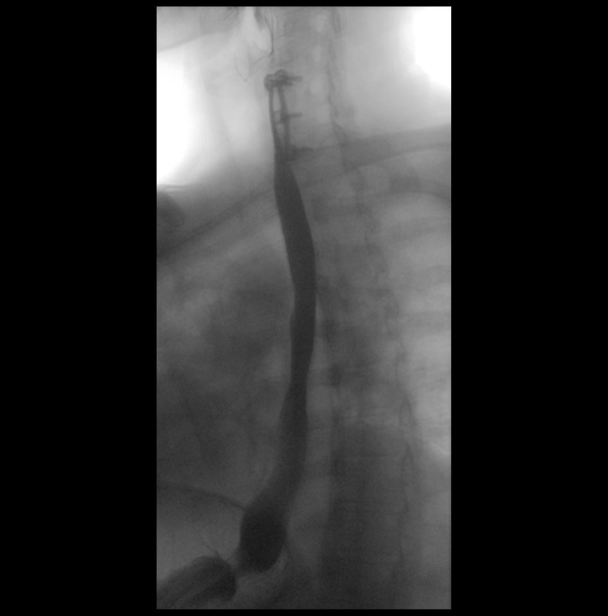
[frame 154/180]
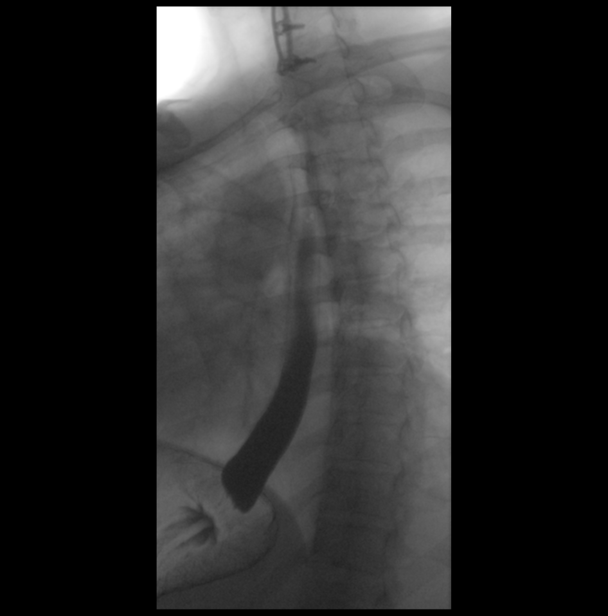

[Series 8: cp_standard · 0.28mm/px · 1 of 239 frames shown (8 of 9)]
[frame 120/239]
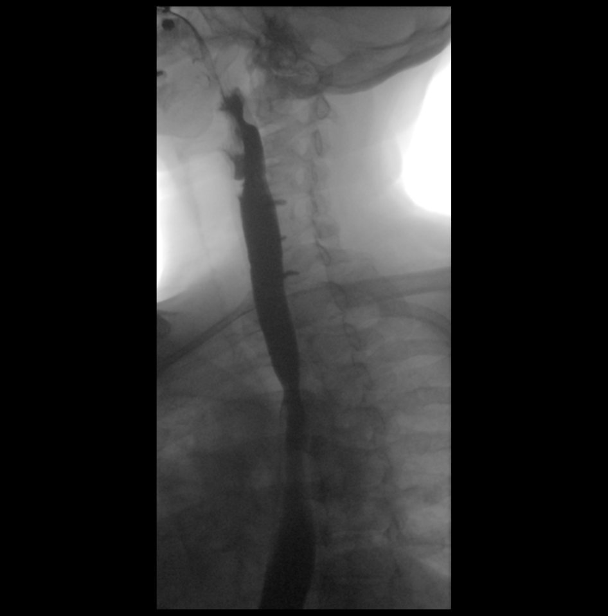

[Series 9: cp_standard · 0.28mm/px · 1 of 1 slices shown (9 of 9)]
[im 1/1]
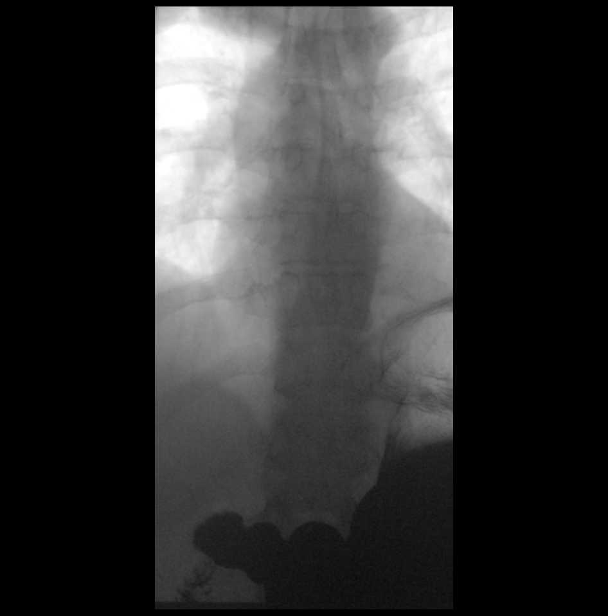

[14 of 24 positions shown; findings below may reference images not displayed]

FINDINGS: Hypopharyngeal portion of the exam is unremarkable. Note is made of
anterior cervical spine fixation at C5-7.

Double contrast evaluation of the esophagus demonstrates no mucosal
abnormality.

Evaluation of esophageal motility demonstrates proximal escape waves
and contrast stasis in the upper and mid esophagus.

Full column evaluation of the esophagus demonstrates no persistent
narrowing or stricture.

A 13 mm barium tablet passes promptly.
IMPRESSION: 1. Mild esophageal dysmotility, likely early presbyesophagus.
2. Otherwise, no explanation for patient's symptoms.

## 2022-02-19 ENCOUNTER — Encounter (HOSPITAL_COMMUNITY): Payer: Self-pay | Admitting: Emergency Medicine

## 2022-02-19 ENCOUNTER — Emergency Department (HOSPITAL_COMMUNITY): Payer: BC Managed Care – PPO

## 2022-02-19 ENCOUNTER — Emergency Department (HOSPITAL_COMMUNITY)
Admission: EM | Admit: 2022-02-19 | Discharge: 2022-02-19 | Disposition: A | Discharge status: Home / Self Care | Payer: BC Managed Care – PPO | Attending: Emergency Medicine | Admitting: Emergency Medicine

## 2022-02-19 ENCOUNTER — Other Ambulatory Visit: Payer: Self-pay

## 2022-02-19 DIAGNOSIS — Z79899 Other long term (current) drug therapy: Secondary | ICD-10-CM | POA: Insufficient documentation

## 2022-02-19 DIAGNOSIS — R1032 Left lower quadrant pain: Secondary | ICD-10-CM | POA: Insufficient documentation

## 2022-02-19 DIAGNOSIS — I10 Essential (primary) hypertension: Secondary | ICD-10-CM | POA: Insufficient documentation

## 2022-02-19 DIAGNOSIS — M545 Low back pain, unspecified: Secondary | ICD-10-CM | POA: Insufficient documentation

## 2022-02-19 LAB — URINALYSIS, ROUTINE W REFLEX MICROSCOPIC
Bilirubin Urine: NEGATIVE
Glucose, UA: NEGATIVE mg/dL
Hgb urine dipstick: NEGATIVE
Ketones, ur: NEGATIVE mg/dL
Leukocytes,Ua: NEGATIVE
Nitrite: NEGATIVE
Protein, ur: NEGATIVE mg/dL
Specific Gravity, Urine: 1.028 (ref 1.005–1.030)
pH: 5 (ref 5.0–8.0)

## 2022-02-19 MED ORDER — NAPROXEN 500 MG PO TABS: 500.0000 mg | ORAL_TABLET | Freq: Two times a day (BID) | ORAL | 0 refills | Status: DC

## 2022-02-19 MED ORDER — HYDROCODONE-ACETAMINOPHEN 5-325 MG PO TABS: 1.0000 | ORAL_TABLET | Freq: Four times a day (QID) | ORAL | 0 refills | Status: DC | PRN

## 2022-02-19 MED ORDER — HYDROCODONE-ACETAMINOPHEN 5-325 MG PO TABS
2.0000 | ORAL_TABLET | Freq: Once | ORAL | Status: AC
  Administered 2022-02-19: 2 via ORAL
  Filled 2022-02-19: qty 2

## 2022-02-19 MED ORDER — KETOROLAC TROMETHAMINE 60 MG/2ML IM SOLN
60.0000 mg | Freq: Once | INTRAMUSCULAR | Status: AC
  Administered 2022-02-19: 60 mg via INTRAMUSCULAR
  Filled 2022-02-19: qty 2

## 2022-02-19 NOTE — ED Provider Notes (Signed)
[facility] EMERGENCY DEPARTMENT Provider Note   CSN: [id number] Arrival date & time: 02/19/22  0345     History  Chief Complaint  Patient presents with   Back Pain    RAYNALD ROUILLARD is a 61 y.o. male.  Patient is a 61 year old male with history of hypertension and hyperlipidemia.  Patient presenting today with complaints of left flank pain.  This has been bothering him intermittently for the past week.  The pain seems to be worse when he attempts to stand and walk.  Once he is upright and ambulatory, the pain dissipates.  He denies any radiation of the pain into his legs.  He denies any bowel or bladder complaints.  He has had kidney stones in the past, but this feels different.  He tried taking Robaxin this evening, but this did not help.  The history is provided by the patient.       Home Medications Prior to Admission medications   Medication Sig Start Date End Date Taking? Authorizing Provider  dicyclomine (BENTYL) 10 MG capsule Take 1 capsule (10 mg total) by mouth 3 (three) times daily as needed (diarrhea abd pain). Patient not taking: Reported on 10/23/2019 10/19/19   Laurine Blazer B, PA-C  telmisartan (MICARDIS) 80 MG tablet Take 80 mg by mouth daily.    [provider]      Allergies    Beef-derived products    Review of Systems   Review of Systems  All other systems reviewed and are negative.   Physical Exam Updated Vital Signs BP (!) 142/107   Pulse 84   Temp 97.8 F (36.6 C)   Resp 18   Ht '5\' 10"'$  (1.778 m)   Wt 77.1 kg   SpO2 97%   BMI 24.39 kg/m  Physical Exam Vitals and nursing note reviewed.  Constitutional:      General: He is not in acute distress.    Appearance: He is well-developed. He is not diaphoretic.  HENT:     Head: Normocephalic and atraumatic.  Cardiovascular:     Rate and Rhythm: Normal rate and regular rhythm.     Heart sounds: No murmur heard.    No friction rub.  Pulmonary:     Effort: Pulmonary effort is  normal. No respiratory distress.     Breath sounds: Normal breath sounds. No wheezing or rales.  Abdominal:     General: Bowel sounds are normal. There is no distension.     Palpations: Abdomen is soft.     Tenderness: There is no abdominal tenderness.  Musculoskeletal:        General: Normal range of motion.     Cervical back: Normal range of motion and neck supple.  Skin:    General: Skin is warm and dry.  Neurological:     Mental Status: He is alert and oriented to person, place, and time.     Coordination: Coordination normal.     Comments: DTRs are 1+ and symmetrical in the patellar and Achilles tendons bilaterally.  Strength is 5 out of 5 in both lower extremities.  He is ambulatory without difficulty.     ED Results / Procedures / Treatments   Labs (all labs ordered are listed, but only abnormal results are displayed) Labs Reviewed  URINALYSIS, ROUTINE W REFLEX MICROSCOPIC    EKG None  Radiology No results found.  Procedures Procedures    Medications Ordered in ED Medications  ketorolac (TORADOL) injection 60 mg (has no administration in time  range)  HYDROcodone-acetaminophen (NORCO/VICODIN) 5-325 MG per tablet 2 tablet (has no administration in time range)    ED Course/ Medical Decision Making/ A&P  Patient is a 61 year old male presenting with left flank pain as described in the HPI.  He arrives here with stable vital signs and is otherwise well-appearing.  Urinalysis and renal CT obtained, both of which are unremarkable showing no evidence for infection or obstructive uropathy.  Patient symptoms are worse when he attempts to stand, but improves once he is up and around.  Symptoms most consistent with a musculoskeletal etiology.  His bowel and bladder function is intact and there are no neurologic deficits that would indicate an emergent situation.  He was given Toradol and hydrocodone here in the ER and seems to be feeling better.  At this point, feel as  though he can safely be discharged with NSAIDs, pain medication, and follow-up as needed if not improving.  Final Clinical Impression(s) / ED Diagnoses Final diagnoses:  None    Rx / DC Orders ED Discharge Orders     None         Veryl Speak, MD 02/19/22 0505

## 2022-02-19 NOTE — Discharge Instructions (Signed)
Begin taking naproxen as prescribed.  Begin taking hydrocodone as prescribed as needed for pain not relieved with naproxen.  Follow-up with primary doctor if symptoms or not improving in the next week, and return to the ER if symptoms significantly worsen or change.

## 2022-02-19 NOTE — ED Triage Notes (Signed)
Pt c/o lower left back pain that has been going on a week but worse today.

## 2022-03-02 ENCOUNTER — Encounter (INDEPENDENT_AMBULATORY_CARE_PROVIDER_SITE_OTHER): Payer: Self-pay | Admitting: Gastroenterology

## 2022-03-19 ENCOUNTER — Ambulatory Visit (INDEPENDENT_AMBULATORY_CARE_PROVIDER_SITE_OTHER): Payer: BC Managed Care – PPO

## 2022-03-19 ENCOUNTER — Ambulatory Visit: Payer: BC Managed Care – PPO | Admitting: Orthopedic Surgery

## 2022-03-19 ENCOUNTER — Encounter: Payer: Self-pay | Admitting: Orthopedic Surgery

## 2022-03-19 VITALS — BP 163/93 | HR 87 | Ht 70.0 in | Wt 170.0 lb

## 2022-03-19 DIAGNOSIS — G8929 Other chronic pain: Secondary | ICD-10-CM

## 2022-03-19 DIAGNOSIS — M25562 Pain in left knee: Secondary | ICD-10-CM

## 2022-03-19 DIAGNOSIS — M1712 Unilateral primary osteoarthritis, left knee: Secondary | ICD-10-CM | POA: Diagnosis not present

## 2022-03-19 NOTE — Patient Instructions (Signed)
Diclofenac gel apply 4 x  a  day

## 2022-03-19 NOTE — Progress Notes (Signed)
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  We have seen him in the past but it was over 5 years ago had severe arthritis at that time.  The patient says that his needle locked it concerned him he applied Voltaren gel and it seems to have helped a lot.  He is concerned now that his knee is starting to lock  Review of systems no evidence of chest pain or shortness of breath bowel and bladder function are intact  Past Medical History:  Diagnosis Date   Hypertension    Kidney stones     Current Outpatient Medications  Medication Instructions   amLODipine (NORVASC) 5 mg, Oral, Daily   telmisartan (MICARDIS) 80 mg, Oral, Daily     BP (!) 163/93   Pulse 87   Ht '5\' 10"'$  (1.778 m)   Wt 170 lb (77.1 kg)   BMI 24.39 kg/m   He is awake and alert he is oriented x 3  Mood and affect are pleasant and no evidence of depression  Neurologic exam normal sensation in both lower extremities no tremors  Examination of the left knee.  The patient's gait shows a slightly altered gait favoring that left leg  He is in varus alignment  He is tender over the medial joint line.  He has a slight flexion contracture less than 5 degrees his total flexion arc is approximately 120 degrees.  He does not exhibit any ligamentous instability  Skin was normal  X-rays show severe grade 4 arthritis of the left knee he has osteophytes and sclerosis varus alignment  Currently the patient is not having the same symptoms.  I encouraged him to use the Voltaren gel.  He is considering knee replacement surgery.  I told him that if his activity does not change he is likely to have further locking episodes

## 2022-04-08 ENCOUNTER — Encounter (INDEPENDENT_AMBULATORY_CARE_PROVIDER_SITE_OTHER): Payer: Self-pay | Admitting: *Deleted

## 2022-06-11 ENCOUNTER — Telehealth (INDEPENDENT_AMBULATORY_CARE_PROVIDER_SITE_OTHER): Payer: Self-pay | Admitting: *Deleted

## 2022-06-11 NOTE — Telephone Encounter (Signed)
Who is your primary care physician: Dr. Willey Blade  Reasons for the colonoscopy: recall  Have you had a colonoscopy before?  2019  Do you have family history of colon cancer? Yes father  Previous colonoscopy with polyps removed? no  Do you have a history colorectal cancer?   yes  Are you diabetic? If yes, Type 1 or Type 2?    no Do you have a prosthetic or mechanical heart valve? no  Do you have a pacemaker/defibrillator?   no  Have you had endocarditis/atrial fibrillation? no  Have you had joint replacement within the last 12 months?  no  Do you tend to be constipated or have to use laxatives? no  Do you have any history of drugs or alchohol?  no  Do you use supplemental oxygen?  no  Have you had a stroke or heart attack within the last 6 months? no  Do you take weight loss medication?  no  Do you take any blood-thinning medications such as: (aspirin, warfarin, Plavix, Aggrenox)  no  If yes we need the name, milligram, dosage and who is prescribing doctor  Current Outpatient Medications on File Prior to Visit  Medication Sig Dispense Refill   amLODipine (NORVASC) 5 MG tablet Take 5 mg by mouth daily.     naproxen sodium (ALEVE) 220 MG tablet Take 220 mg by mouth daily as needed.     rosuvastatin (CRESTOR) 10 MG tablet Take 10 mg by mouth at bedtime.     telmisartan (MICARDIS) 80 MG tablet Take 80 mg by mouth daily.     No current facility-administered medications on file prior to visit.    Allergies  Allergen Reactions   Beef-Derived Products Other (See Comments)    Due to tick bite, can eat with Benadryl      Pharmacy: [facility]  Primary Insurance Name: [employer]  Best number where you can be reached: [phone]

## 2022-06-11 NOTE — Telephone Encounter (Signed)
Any room, Thanks

## 2022-06-12 MED ORDER — PEG 3350-KCL-NA BICARB-NACL 420 G PO SOLR: 4000.0000 mL | Freq: Once | ORAL | 0 refills | Status: AC

## 2022-06-12 NOTE — Addendum Note (Signed)
Addended by: Cheron Every on: 06/12/2022 09:02 AM   Modules accepted: Orders

## 2022-06-12 NOTE — Telephone Encounter (Signed)
Spoke with pt. He has been scheduled for 3/20 at 730am as he wanted 1sdt case of day. Aware will send rx for prep to pharmacy. Instructions mailed.

## 2022-06-15 NOTE — Telephone Encounter (Signed)
Questionnaire from recall, no referral needed  

## 2022-06-18 ENCOUNTER — Encounter: Payer: Self-pay | Admitting: Radiology

## 2022-07-08 ENCOUNTER — Encounter (HOSPITAL_COMMUNITY)
Admission: RE | Disposition: A | Discharge status: Home / Self Care | Payer: Self-pay | Source: Home / Self Care | Attending: Gastroenterology

## 2022-07-08 ENCOUNTER — Ambulatory Visit (HOSPITAL_COMMUNITY): Payer: BC Managed Care – PPO | Admitting: Anesthesiology

## 2022-07-08 ENCOUNTER — Encounter (INDEPENDENT_AMBULATORY_CARE_PROVIDER_SITE_OTHER): Payer: Self-pay | Admitting: *Deleted

## 2022-07-08 ENCOUNTER — Ambulatory Visit (HOSPITAL_COMMUNITY)
Admission: RE | Admit: 2022-07-08 | Discharge: 2022-07-08 | Disposition: A | Discharge status: Home / Self Care | Payer: BC Managed Care – PPO | Attending: Gastroenterology | Admitting: Gastroenterology

## 2022-07-08 ENCOUNTER — Other Ambulatory Visit: Payer: Self-pay

## 2022-07-08 ENCOUNTER — Encounter (HOSPITAL_COMMUNITY): Payer: Self-pay | Admitting: Gastroenterology

## 2022-07-08 DIAGNOSIS — K5730 Diverticulosis of large intestine without perforation or abscess without bleeding: Secondary | ICD-10-CM | POA: Diagnosis not present

## 2022-07-08 DIAGNOSIS — K648 Other hemorrhoids: Secondary | ICD-10-CM | POA: Diagnosis not present

## 2022-07-08 DIAGNOSIS — Z1211 Encounter for screening for malignant neoplasm of colon: Secondary | ICD-10-CM | POA: Diagnosis present

## 2022-07-08 DIAGNOSIS — Z800 Family history of malignant neoplasm of digestive organs: Secondary | ICD-10-CM | POA: Diagnosis not present

## 2022-07-08 DIAGNOSIS — Z79899 Other long term (current) drug therapy: Secondary | ICD-10-CM | POA: Insufficient documentation

## 2022-07-08 DIAGNOSIS — I10 Essential (primary) hypertension: Secondary | ICD-10-CM | POA: Diagnosis not present

## 2022-07-08 DIAGNOSIS — Z86010 Personal history of colonic polyps: Secondary | ICD-10-CM

## 2022-07-08 HISTORY — PX: COLONOSCOPY WITH PROPOFOL: SHX5780

## 2022-07-08 LAB — HM COLONOSCOPY

## 2022-07-08 SURGERY — COLONOSCOPY WITH PROPOFOL
Anesthesia: General

## 2022-07-08 MED ORDER — LIDOCAINE HCL (CARDIAC) PF 100 MG/5ML IV SOSY
PREFILLED_SYRINGE | INTRAVENOUS | Status: DC | PRN
  Administered 2022-07-08: 50 mg via INTRATRACHEAL

## 2022-07-08 MED ORDER — PROPOFOL 500 MG/50ML IV EMUL
INTRAVENOUS | Status: DC | PRN
  Administered 2022-07-08: 200 ug/kg/min via INTRAVENOUS

## 2022-07-08 MED ORDER — LACTATED RINGERS IV SOLN: INTRAVENOUS | Status: DC

## 2022-07-08 MED ORDER — PROPOFOL 10 MG/ML IV BOLUS
INTRAVENOUS | Status: DC | PRN
  Administered 2022-07-08: 90 mg via INTRAVENOUS

## 2022-07-08 MED ORDER — STERILE WATER FOR IRRIGATION IR SOLN
Status: DC | PRN
  Administered 2022-07-08: 120 mL

## 2022-07-08 NOTE — Discharge Instructions (Signed)
You are being discharged to home.  Resume your previous diet.  Your physician has recommended a repeat colonoscopy in five years for screening purposes.  

## 2022-07-08 NOTE — Anesthesia Postprocedure Evaluation (Signed)
Anesthesia Post Note  Patient: Eddie Salazar  Procedure(s) Performed: COLONOSCOPY WITH PROPOFOL  Patient location during evaluation: Phase II Anesthesia Type: General Level of consciousness: awake and alert and oriented Pain management: pain level controlled Vital Signs Assessment: post-procedure vital signs reviewed and stable Respiratory status: spontaneous breathing, nonlabored ventilation and respiratory function stable Cardiovascular status: blood pressure returned to baseline and stable Postop Assessment: no apparent nausea or vomiting Anesthetic complications: no  No notable events documented.   Last Vitals:  Vitals:   07/08/22 0810 07/08/22 0815  BP: (!) 95/49 117/78  Pulse: 79 90  Resp: 16 (!) 23  Temp: 36.9 C   SpO2: 93% 95%    Last Pain:  Vitals:   07/08/22 0810  TempSrc: Axillary  PainSc:                  Geary Rufo C Eevie Lapp

## 2022-07-08 NOTE — Transfer of Care (Signed)
Immediate Anesthesia Transfer of Care Note  Patient: Eddie Salazar  Procedure(s) Performed: COLONOSCOPY WITH PROPOFOL  Patient Location: Endoscopy Unit  Anesthesia Type:General  Level of Consciousness: awake, alert , and oriented  Airway & Oxygen Therapy: Patient Spontanous Breathing  Post-op Assessment: Report given to RN, Post -op Vital signs reviewed and stable, Patient moving all extremities X 4, and Patient able to stick tongue midline  Post vital signs: Reviewed  Last Vitals:  Vitals Value Taken Time  BP 95/49 07/08/22 0810  Temp 36.9 C 07/08/22 0810  Pulse 79 07/08/22 0810  Resp 16 07/08/22 0810  SpO2 93 % 07/08/22 0810    Last Pain:  Vitals:   07/08/22 0810  TempSrc: Axillary  PainSc:       Patients Stated Pain Goal: 7 ([id number])  Complications: No notable events documented.

## 2022-07-08 NOTE — H&P (Signed)
Eddie Salazar is an 62 y.o. male.   Chief Complaint: family history of colonj cancer HPI: 62 y/o M , coming for family Hx colon cancer.  The patient denies having any complaints such as melena, hematochezia, abdominal pain or distention, change in her bowel movement consistency or frequency, no changes in weight recently. Father had colon cancer before age 23.   Past Medical History:  Diagnosis Date   Hypertension    Kidney stones     Past Surgical History:  Procedure Laterality Date   COLONOSCOPY  01/13/2012   Procedure: COLONOSCOPY;  Surgeon: Rogene Houston, MD;  Location: [facility];  Service: Endoscopy;  Laterality: N/A;  830   COLONOSCOPY N/A 05/19/2017   Procedure: COLONOSCOPY;  Surgeon: Rogene Houston, MD;  Location: [facility];  Service: Endoscopy;  Laterality: N/A;  [place] TENDON REPAIR  02/12/2012   Procedure: DISTAL BICEPS TENDON REPAIR;  Surgeon: Carole Civil, MD;  Location: [facility];  Service: Orthopedics;  Laterality: Left;   FOOT SURGERY     HERNIA REPAIR     KNEE ARTHROSCOPY Right    NECK SURGERY     ROTATOR CUFF REPAIR      Family History  Problem Relation Age of Onset   Dementia Mother    Cancer Other    Social History:  reports that he has never smoked. He has never used smokeless tobacco. He reports that he does not drink alcohol and does not use drugs.  Allergies:  Allergies  Allergen Reactions   Beef-Derived Products Other (See Comments)    Due to tick bite, can eat with Benadryl   Alpha Gal*    Medications Prior to Admission  Medication Sig Dispense Refill   amLODipine (NORVASC) 5 MG tablet Take 5 mg by mouth daily.     naproxen sodium (ALEVE) 220 MG tablet Take 220 mg by mouth daily as needed.     rosuvastatin (CRESTOR) 10 MG tablet Take 10 mg by mouth at bedtime.     telmisartan (MICARDIS) 80 MG tablet Take 80 mg by mouth daily.      No results found for this or any previous visit (from the past 48 hour(s)). No  results found.  Review of Systems  All other systems reviewed and are negative.   Blood pressure 139/88, pulse 77, temperature 98.3 F (36.8 C), temperature source Oral, resp. rate 13, height 5\' 10"  (1.778 m), weight 78 kg, SpO2 94 %. Physical Exam  GENERAL: The patient is AO x3, in no acute distress. HEENT: Head is normocephalic and atraumatic. EOMI are intact. Mouth is well hydrated and without lesions. NECK: Supple. No masses LUNGS: Clear to auscultation. No presence of rhonchi/wheezing/rales. Adequate chest expansion HEART: RRR, normal s1 and s2. ABDOMEN: Soft, nontender, no guarding, no peritoneal signs, and nondistended. BS +. No masses. EXTREMITIES: Without any cyanosis, clubbing, rash, lesions or edema. NEUROLOGIC: AOx3, no focal motor deficit. SKIN: no jaundice, no rashes  Assessment/Plan  62 y/o M , coming for family Hx colon cancer.  We will proceed with colonoscopy/  [name], MD 07/08/2022, 7:39 AM

## 2022-07-08 NOTE — Op Note (Signed)
[facility] Patient Name: Eddie Salazar Procedure Date: 07/08/2022 7:16 AM MRN: [id number] Date of Birth: May 07, 1960 Attending MD: Maylon Peppers , , [id number] CSN: [id number] Age: 62 Admit Type: Outpatient Procedure:                Colonoscopy Indications:              Screening in patient at increased risk: Family                            history of 1st-degree relative with colorectal                            cancer before age 48 years Providers:                Maylon Peppers, Lambert Mody, Kristine L.                            Risa Grill, Technician Referring MD:              Medicines:                Monitored Anesthesia Care Complications:            No immediate complications. Estimated Blood Loss:     Estimated blood loss: none. Procedure:                Pre-Anesthesia Assessment:                           - Prior to the procedure, a History and Physical                            was performed, and patient medications, allergies                            and sensitivities were reviewed. The patient's                            tolerance of previous anesthesia was reviewed.                           - The risks and benefits of the procedure and the                            sedation options and risks were discussed with the                            patient. All questions were answered and informed                            consent was obtained.                           - ASA Grade Assessment: I - A normal, healthy                            patient.  After obtaining informed consent, the colonoscope                            was passed under direct vision. Throughout the                            procedure, the patient's blood pressure, pulse, and                            oxygen saturations were monitored continuously. The                            [id number] scope was introduced through                             the anus and advanced to the the terminal ileum.                            The colonoscopy was performed without difficulty.                            The patient tolerated the procedure well. The                            quality of the bowel preparation was excellent. Scope In: 7:48:33 AM Scope Out: 8:06:23 AM Scope Withdrawal Time: 0 hours 13 minutes 3 seconds  Total Procedure Duration: 0 hours 17 minutes 50 seconds  Findings:      The perianal and digital rectal examinations were normal.      The terminal ileum appeared normal.      A few small-mouthed diverticula were found in the sigmoid colon.      Non-bleeding internal hemorrhoids were found during retroflexion. The       hemorrhoids were small. Impression:               - The examined portion of the ileum was normal.                           - Diverticulosis in the sigmoid colon.                           - Non-bleeding internal hemorrhoids.                           - No specimens collected. Moderate Sedation:      Per Anesthesia Care Recommendation:           - Discharge patient to home (ambulatory).                           - Resume previous diet.                           - Repeat colonoscopy in 5 years for screening  purposes. Procedure Code(s):        --- Professional ---                           [id number], Colorectal cancer screening; colonoscopy on                            individual at high risk Diagnosis Code(s):        --- Professional ---                           Z80.0, Family history of malignant neoplasm of                            digestive organs                           K64.8, Other hemorrhoids                           K57.30, Diverticulosis of large intestine without                            perforation or abscess without bleeding CPT copyright 2022 American Medical Association. All rights reserved. The codes documented in this report are preliminary and upon coder  review may  be revised to meet current compliance requirements. Maylon Peppers, MD Maylon Peppers,  07/08/2022 8:14:34 AM This report has been signed electronically. Number of Addenda: 0

## 2022-07-08 NOTE — Anesthesia Preprocedure Evaluation (Addendum)
Anesthesia Evaluation  Patient identified by MRN, date of birth, ID band Patient awake    Reviewed: Allergy & Precautions, H&P , NPO status , Patient's Chart, lab work & pertinent test results  Airway Mallampati: I  TM Distance: >3 FB Neck ROM: Full    Dental  (+) Dental Advisory Given, Teeth Intact, Caps   Pulmonary neg pulmonary ROS   Pulmonary exam normal breath sounds clear to auscultation       Cardiovascular Exercise Tolerance: Good hypertension, Pt. on medications Normal cardiovascular exam Rhythm:Regular Rate:Normal     Neuro/Psych negative neurological ROS  negative psych ROS   GI/Hepatic negative GI ROS, Neg liver ROS,,,  Endo/Other  negative endocrine ROS    Renal/GU Renal disease (stones)  negative genitourinary   Musculoskeletal  (+) Arthritis , Osteoarthritis,    Abdominal   Peds negative pediatric ROS (+)  Hematology negative hematology ROS (+)   Anesthesia Other Findings   Reproductive/Obstetrics negative OB ROS                             Anesthesia Physical Anesthesia Plan  ASA: 2  Anesthesia Plan: General   Post-op Pain Management: Minimal or no pain anticipated   Induction: Intravenous  PONV Risk Score and Plan: Propofol infusion  Airway Management Planned: Nasal Cannula and Natural Airway  Additional Equipment:   Intra-op Plan:   Post-operative Plan:   Informed Consent: I have reviewed the patients History and Physical, chart, labs and discussed the procedure including the risks, benefits and alternatives for the proposed anesthesia with the patient or authorized representative who has indicated his/her understanding and acceptance.     Dental advisory given  Plan Discussed with: CRNA and Surgeon  Anesthesia Plan Comments:        Anesthesia Quick Evaluation

## 2022-07-16 ENCOUNTER — Encounter (HOSPITAL_COMMUNITY): Payer: Self-pay | Admitting: Gastroenterology

## 2022-09-17 ENCOUNTER — Other Ambulatory Visit (HOSPITAL_COMMUNITY): Payer: Self-pay | Admitting: Internal Medicine

## 2022-09-17 ENCOUNTER — Ambulatory Visit (HOSPITAL_COMMUNITY)
Admission: RE | Admit: 2022-09-17 | Discharge: 2022-09-17 | Disposition: A | Discharge status: Home / Self Care | Payer: BC Managed Care – PPO | Source: Ambulatory Visit | Attending: Internal Medicine | Admitting: Internal Medicine

## 2022-09-17 DIAGNOSIS — R059 Cough, unspecified: Secondary | ICD-10-CM

## 2023-12-16 ENCOUNTER — Other Ambulatory Visit (INDEPENDENT_AMBULATORY_CARE_PROVIDER_SITE_OTHER): Payer: Self-pay

## 2023-12-16 ENCOUNTER — Ambulatory Visit (INDEPENDENT_AMBULATORY_CARE_PROVIDER_SITE_OTHER): Admitting: Orthopedic Surgery

## 2023-12-16 ENCOUNTER — Encounter: Payer: Self-pay | Admitting: Orthopedic Surgery

## 2023-12-16 VITALS — BP 140/88 | HR 85 | Ht 70.0 in | Wt 184.0 lb

## 2023-12-16 DIAGNOSIS — M545 Low back pain, unspecified: Secondary | ICD-10-CM

## 2023-12-16 DIAGNOSIS — M4316 Spondylolisthesis, lumbar region: Secondary | ICD-10-CM

## 2023-12-16 DIAGNOSIS — M4310 Spondylolisthesis, site unspecified: Secondary | ICD-10-CM

## 2023-12-16 NOTE — Progress Notes (Signed)
  Intake history:  BP (!) 140/88   Pulse 85   Ht 5' 10 (1.778 m)   Wt 184 lb (83.5 kg)   BMI 26.40 kg/m  Body mass index is 26.4 kg/m.    WHAT ARE WE SEEING YOU FOR TODAY?   back - right lower back  Radiation?: yes - down R leg to foot.   Loss of bowel/urine control?  no  How long has this bothered you? (DOI?DOS?WS?)  approximately 6 month(s) ago  Was there an injury? No  Anticoag.  No  Diabetes No  Heart disease No  Hypertension Yes  SMOKING HX No  Kidney disease No  Any ALLERGIES Beef ____________________   Treatment:  Have you taken:  Tylenol  No  Advil No  Had PT No  Had injection No  Other  Aleve_________________________

## 2023-12-16 NOTE — Progress Notes (Signed)
 Patient ID: Eddie Salazar, male   DOB: March 12, 1961, 63 y.o.   MRN: [id number]  Office Visit Note   Patient: Eddie Salazar           Date of Birth: Jul 16, 1960           MRN: [id number] Visit Date: 12/16/2023 Requested by: Sheryle Carwin, MD [address] [place],  [place] [address] PCP: Sheryle Carwin, MD  Assessment & Plan:  Images personally read and my interpretation : Plain films show grade 1 L4-5 spondylolisthesis with facet arthritis L3-S1 mild curve in the spine  Seems to have positional symptoms  Recommend referral to orthospine for further definitive care  Visit Diagnoses:  1. Lumbar pain   2. Spondylolisthesis at L4-L5 level   3. Degenerative spondylolisthesis     Plan: MRI referral to Dr. Georgina  Follow-Up Instructions: Return for REFERRAL.   Orders:  No orders of the defined types were placed in this encounter.    Chief Complaint  Patient presents with   Back Pain    HPI Eddie Salazar is a 63 y.o. male.  Presents with lower back pain and right leg radicular pain for 6 months  The patient says he gets pain when he standing and he has to stand a lot for work He also has frequent giving way episodes of the right lower extremity  Treatment Aleve   He was seen for this in 2017   Allergies  Allergen Reactions   Beef-Derived Drug Products Other (See Comments)    Due to tick bite, can eat with Benadryl   Alpha Gal*   Current Outpatient Medications  Medication Instructions   amLODipine (NORVASC) 5 mg, Oral, Daily   naproxen  sodium (ALEVE ) 220 mg, Oral, Daily PRN   rosuvastatin (CRESTOR) 10 mg, Oral, Daily at bedtime   telmisartan (MICARDIS) 80 mg, Oral, Daily    Review of Systems Review of Systems  Constitutional:  Negative for chills and fever.  Respiratory:  Negative for shortness of breath.   Cardiovascular:  Negative for chest pain.    Past Medical History:  Diagnosis Date   Hypertension    Kidney stones     Past Surgical  History:  Procedure Laterality Date   COLONOSCOPY  01/13/2012   Procedure: COLONOSCOPY;  Surgeon: Claudis RAYMOND Rivet, MD;  Location: [facility];  Service: Endoscopy;  Laterality: N/A;  830   COLONOSCOPY N/A 05/19/2017   Procedure: COLONOSCOPY;  Surgeon: Rivet Claudis RAYMOND, MD;  Location: [facility];  Service: Endoscopy;  Laterality: N/A;  915   COLONOSCOPY WITH PROPOFOL  N/A 07/08/2022   Procedure: COLONOSCOPY WITH PROPOFOL ;  Surgeon: Eartha Angelia Sieving, MD;  Location: [facility];  Service: Gastroenterology;  Laterality: N/A;  730amn, asa 1-2   DISTAL BICEPS TENDON REPAIR  02/12/2012   Procedure: DISTAL BICEPS TENDON REPAIR;  Surgeon: Taft FORBES Minerva, MD;  Location: [facility];  Service: Orthopedics;  Laterality: Left;   FOOT SURGERY     HERNIA REPAIR     KNEE ARTHROSCOPY Right    NECK SURGERY     ROTATOR CUFF REPAIR      Family History  Problem Relation Age of Onset   Dementia Mother    Cancer Other    was reviewed  Social History Social History   Tobacco Use   Smoking status: Never   Smokeless tobacco: Never  Vaping Use   Vaping status: Never Used  Substance Use Topics   Alcohol use: No   Drug use:  No    Allergies  Allergen Reactions   Beef-Derived Drug Products Other (See Comments)    Due to tick bite, can eat with Benadryl   Alpha Gal*    Current Outpatient Medications  Medication Sig Dispense Refill   amLODipine (NORVASC) 5 MG tablet Take 5 mg by mouth daily.     naproxen  sodium (ALEVE ) 220 MG tablet Take 220 mg by mouth daily as needed.     rosuvastatin (CRESTOR) 10 MG tablet Take 10 mg by mouth at bedtime.     telmisartan (MICARDIS) 80 MG tablet Take 80 mg by mouth daily.     No current facility-administered medications for this visit.     Physical Exam BP (!) 140/88   Pulse 85   Ht 5' 10 (1.778 m)   Wt 184 lb (83.5 kg)   BMI 26.40 kg/m   Gen. appearance: The patient is well-developed and well-nourished grooming and hygiene are normal The  patient is oriented to person place and time The patient's mood is normal and the affect is normal   Gait assessment: The patient stands with  normal gait and station  Lumbar spine Tenderness  to palpation is noted in the lower L4-5 Range of motion normal flexion and extension without pain Muscle tone normal on the right and left sides of the spine  Lower extremities  Normal range of motion hip    Strength right lower extremity L4, L5 Strength left lower extremity L4 L5  Neurologic right lower extremity examination  Reflexes were 0-1+ knee   Sensation was normal both legs     Straight leg raise testing negative bilaterally  The vascular examination no edema

## 2023-12-27 ENCOUNTER — Telehealth: Payer: Self-pay | Admitting: Orthopedic Surgery

## 2023-12-27 NOTE — Telephone Encounter (Signed)
 I spoke to patient, he was told we did not send proper information to his insurance company for the authorization, but Samule did send it on 12/24/23 I am checking with her to see if we have approval or if he needs to reschedule  He is in lobby waiting on me to let him know what we are doing

## 2023-12-27 NOTE — Telephone Encounter (Signed)
 Dr. Margery pt, Eddie Salazar MRN [id number] is here, needs to speak to someone regarding his MRI, he says he has one tomorrow and they want all the $ tomorrow, said we didn't give what was needed

## 2023-12-28 ENCOUNTER — Telehealth: Payer: Self-pay | Admitting: Orthopedic Surgery

## 2023-12-28 ENCOUNTER — Ambulatory Visit (HOSPITAL_COMMUNITY)
Admission: RE | Admit: 2023-12-28 | Discharge: 2023-12-28 | Disposition: A | Discharge status: Home / Self Care | Source: Ambulatory Visit | Attending: Orthopedic Surgery | Admitting: Orthopedic Surgery

## 2023-12-28 ENCOUNTER — Ambulatory Visit (HOSPITAL_COMMUNITY): Admission: RE | Admit: 2023-12-28 | Source: Ambulatory Visit

## 2023-12-28 DIAGNOSIS — M4310 Spondylolisthesis, site unspecified: Secondary | ICD-10-CM | POA: Diagnosis present

## 2023-12-28 DIAGNOSIS — M4316 Spondylolisthesis, lumbar region: Secondary | ICD-10-CM | POA: Diagnosis present

## 2023-12-28 NOTE — Telephone Encounter (Signed)
 Pt called stating his MRI is tonight and was informed by MRI facility to ask for STAT results so he can have results read tomorrow by Georgina. Please call pt about this matter and send STAT to MRI facility for results. Pt phone number is [phone].

## 2023-12-29 ENCOUNTER — Ambulatory Visit: Admitting: Orthopedic Surgery

## 2023-12-29 ENCOUNTER — Other Ambulatory Visit (INDEPENDENT_AMBULATORY_CARE_PROVIDER_SITE_OTHER): Payer: Self-pay

## 2023-12-29 VITALS — BP 150/95 | HR 86 | Ht 70.0 in | Wt 184.0 lb

## 2023-12-29 DIAGNOSIS — M5416 Radiculopathy, lumbar region: Secondary | ICD-10-CM | POA: Diagnosis not present

## 2023-12-29 DIAGNOSIS — M545 Low back pain, unspecified: Secondary | ICD-10-CM

## 2023-12-29 NOTE — Progress Notes (Signed)
 Orthopedic Spine Surgery Office Note  Assessment: Patient is a 63 y.o. male with right leg numbness when standing or walking for several minutes. As a spondylolisthesis at L4/5 that may be causing L4 nerve irritation resulting in the numbness    Plan: -Explained that initially conservative treatment is tried as a significant number of patients may experience relief with these treatment modalities. Discussed that the conservative treatments include:  -activity modification  -physical therapy  -over the counter pain medications  -medrol dosepak  -lumbar steroid injections -Based on his exam and the fact the pain is in his groin, I do not think that is coming from his back. Explained that that seems to be more from the hip -Since he is not having any radicular pain and his numbness is intermittent, I recommended continued non-operative treatment. I recommended PT and a steroid injection. Referrals provided to him today -Patient has tried tylenol , ibuprofen -Patient should return to office in 3-4 weeks, x-rays at next visit: none   Patient expressed understanding of the plan and all questions were answered to the patient's satisfaction.   ___________________________________________________________________________   History:  Patient is a 63 y.o. male who presents today for lumbar spine.  Patient has had numbness in the right lower extremity for the last several months.  He notes that if he is standing or walking for a while.  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  He is afraid to lose his balance on the ladder.  He feels the numbness throughout the leg.  It does get better if he lays down.  Also gets better if he stretches his back out.  He does not have any radiating leg pain.  He does have groin pain on the right side with weightbearing.  There is no trauma or injury that preceded the onset of his symptoms.   Weakness: Denies Symptoms of imbalance:  Denies Paresthesias and numbness: Yes, gets numbness down the right lower extremity in multiple distributions.  No other numbness or paresthesias Bowel or bladder incontinence: Denies Saddle anesthesia: Denies  Treatments tried: tylenol , ibuprofen  Review of systems: Denies fevers and chills, night sweats, unexplained weight loss, history of cancer, pain that wakes him at night  Past medical history: HTN  Allergies: NKDA  Past surgical history:  Left distal biceps tendon repair Hernia repair Knee arthroscopy Rotator cuff repair  Social history: Denies use of nicotine product (smoking, vaping, patches, smokeless) Alcohol use: denies Denies recreational drug use   Physical Exam:  BMI of 26.4  General: no acute distress, appears stated age Neurologic: alert, answering questions appropriately, following commands Respiratory: unlabored breathing on room air, symmetric chest rise Psychiatric: appropriate affect, normal cadence to speech   MSK (spine):  -Strength exam      Left  Right EHL    5/5  5/5 TA    5/5  5/5 GSC    5/5  5/5 Knee extension  5/5  5/5 Hip flexion   5/5  5/5  -Sensory exam    Sensation intact to light touch in L3-S1 nerve distributions of bilateral lower extremities  -Achilles DTR: 1/4 on the left, 1/4 on the right -Patellar tendon DTR: 1/4 on the left, 1/4 on the right  -Straight leg raise: Negative bilaterally -Clonus: no beats bilaterally  -Left hip exam: No pain through range of motion, negative Stinchfield -Right hip exam: Positive FADIR, pain with internal rotation past 0 degrees, no pain with external rotation, negative  FABER  Imaging: XRs of the lumbar spine from 12/29/2023 were independently reviewed and interpreted, showing spondylolisthesis at L4/5.  Disc height loss at L4/5.  No other significant degenerative changes seen.  No fracture or dislocation seen.  MRI of the lumbar spine from 12/29/2023 was independently reviewed and  interpreted, showing spondylolisthesis at L4/5.  DDD at L4/5.  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  No other significant stenosis seen.    Patient name: Eddie Salazar Patient MRN: [id number] Date of visit: 12/29/23

## 2023-12-31 ENCOUNTER — Ambulatory Visit (HOSPITAL_COMMUNITY)

## 2024-01-10 ENCOUNTER — Telehealth: Payer: Self-pay

## 2024-01-10 NOTE — Telephone Encounter (Signed)
 Lauraine from [employer] called and stated injection had been approved from Oct 6-Dec 5. She did not leave auth #. She also stated the patient was confused about som appts. Returned [employer] but had to leave a message.   I called patient to answer his concerns. Primarily was concerned about scheduling about but didn't have his calendar with him so he was not able to schedule. He will call back.

## 2024-01-17 ENCOUNTER — Ambulatory Visit: Admitting: Orthopedic Surgery

## 2024-01-20 ENCOUNTER — Encounter: Admitting: Orthopedic Surgery

## 2024-01-27 ENCOUNTER — Ambulatory Visit (HOSPITAL_COMMUNITY)

## 2024-02-02 ENCOUNTER — Encounter (INDEPENDENT_AMBULATORY_CARE_PROVIDER_SITE_OTHER): Payer: Self-pay | Admitting: Gastroenterology

## 2024-02-02 ENCOUNTER — Other Ambulatory Visit: Payer: Self-pay

## 2024-02-02 ENCOUNTER — Ambulatory Visit: Admitting: Physical Medicine and Rehabilitation

## 2024-02-02 VITALS — BP 137/84 | HR 76

## 2024-02-02 DIAGNOSIS — M5416 Radiculopathy, lumbar region: Secondary | ICD-10-CM

## 2024-02-02 MED ORDER — METHYLPREDNISOLONE ACETATE 40 MG/ML IJ SUSP
40.0000 mg | Freq: Once | INTRAMUSCULAR | Status: AC
Start: 2024-02-02 — End: 2024-02-02
  Administered 2024-02-02: 40 mg

## 2024-02-02 NOTE — Progress Notes (Signed)
 Pain Scale   Average Pain 10 Patient advising he has chronic lower back pain that increases when standing and eases when walking.        +Driver, -BT, -Dye Allergies.

## 2024-02-02 NOTE — Progress Notes (Signed)
 Eddie Salazar - 63 y.o. male MRN [id number]  Date of birth: 08-27-1960  Office Visit Note: Visit Date: 02/02/2024 PCP: Sheryle Carwin, MD Referred by: Georgina Ozell LABOR, MD  Subjective: Chief Complaint  Patient presents with   Lower Back - Pain   HPI:  Eddie Salazar is a 63 y.o. male who comes in today at the request of Dr. Ozell Georgina for planned Right L4-5 Lumbar Transforaminal epidural steroid injection with fluoroscopic guidance.  The patient has failed conservative care including home exercise, medications, time and activity modification.  This injection will be diagnostic and hopefully therapeutic.  Please see requesting physician notes for further details and justification.   ROS Otherwise per HPI.  Assessment & Plan: Visit Diagnoses:    ICD-10-CM   1. Lumbar radiculopathy  M54.16 XR C-ARM NO REPORT    Epidural Steroid injection    methylPREDNISolone acetate (DEPO-MEDROL) injection 40 mg      Plan: No additional findings.   Meds & Orders:  Meds ordered this encounter  Medications   methylPREDNISolone acetate (DEPO-MEDROL) injection 40 mg    Orders Placed This Encounter  Procedures   XR C-ARM NO REPORT   Epidural Steroid injection    Follow-up: Return for visit to requesting provider as needed.   Procedures: No procedures performed  Lumbosacral Transforaminal Epidural Steroid Injection - Sub-Pedicular Approach with Fluoroscopic Guidance  Patient: Eddie Salazar      Date of Birth: 1961-03-04 MRN: [id number] PCP: Sheryle Carwin, MD      Visit Date: 02/02/2024   Universal Protocol:    Date/Time: 02/02/2024  Consent Given By: the patient  Position: PRONE  Additional Comments: Vital signs were monitored before and after the procedure. Patient was prepped and draped in the usual sterile fashion. The correct patient, procedure, and site was verified.   Injection Procedure Details:   Procedure diagnoses: Lumbar radiculopathy [M54.16]    Meds  Administered:  Meds ordered this encounter  Medications   methylPREDNISolone acetate (DEPO-MEDROL) injection 40 mg    Laterality: Right  Location/Site: L4  Needle:5.0 in., 22 ga.  Short bevel or Quincke spinal needle  Needle Placement: Transforaminal  Findings:    -Comments: Excellent flow of contrast along the nerve, nerve root and into the epidural space.  Procedure Details: After squaring off the end-plates to get a true AP view, the C-arm was positioned so that an oblique view of the foramen as noted above was visualized. The target area is just inferior to the nose of the scotty dog or sub pedicular. The soft tissues overlying this structure were infiltrated with 2-3 ml. of 1% Lidocaine  without Epinephrine .  The spinal needle was inserted toward the target using a trajectory view along the fluoroscope beam.  Under AP and lateral visualization, the needle was advanced so it did not puncture dura and was located close the 6 O'Clock position of the pedical in AP tracterory. Biplanar projections were used to confirm position. Aspiration was confirmed to be negative for CSF and/or blood. A 1-2 ml. volume of Isovue -250 was injected and flow of contrast was noted at each level. Radiographs were obtained for documentation purposes.   After attaining the desired flow of contrast documented above, a 0.5 to 1.0 ml test dose of 0.25% Marcaine  was injected into each respective transforaminal space.  The patient was observed for 90 seconds post injection.  After no sensory deficits were reported, and normal lower extremity motor function was noted,   the above injectate was administered  so that equal amounts of the injectate were placed at each foramen (level) into the transforaminal epidural space.   Additional Comments:  The patient tolerated the procedure well Dressing: 2 x 2 sterile gauze and Band-Aid    Post-procedure details: Patient was observed during the procedure. Post-procedure  instructions were reviewed.  Patient left the clinic in stable condition.    Clinical History: MRI LUMBAR SPINE WITHOUT CONTRAST   TECHNIQUE: Multiplanar, multisequence MR imaging of the lumbar spine was performed. No intravenous contrast was administered.   COMPARISON:  Prior Study from 04/03/2016.   FINDINGS: Segmentation: Standard. Lowest well-formed disc space labeled the L5-S1 level.   Alignment: 3 mm anterolisthesis of L3 on L4, 6 mm anterolisthesis of L4 on L5, with 2 mm anterolisthesis of L5 on S1. Findings chronic and facet mediated. No visible pars defect by MRI.   Vertebrae: Vertebral body height maintained without acute or chronic fracture. Bone marrow signal intensity within normal limits. Few small benign hemangiomata noted. No worrisome osseous lesions. Mild reactive marrow edema noted about the right L2-3 and L3-4 facets due to facet arthritis. No other abnormal marrow edema.   Conus medullaris and cauda equina: Conus extends to the L1 level. Conus and cauda equina appear normal.   Paraspinal and other soft tissues: Unremarkable.   Disc levels:   L1-2: Normal interspace. Mild bilateral facet spurring. No stenosis.   L2-3: Mild disc bulge with disc desiccation. Mild left greater than right facet hypertrophy. Resultant mild narrowing of the left lateral recess. Central canal remains patent. Mild right with moderate left L2 foraminal stenosis.   L3-4: Anterolisthesis. Disc desiccation with mild disc bulge. Advanced bilateral facet arthrosis. No significant spinal stenosis. Mild bilateral L3 foraminal narrowing.   L4-5: 6 mm anterolisthesis. Disc desiccation with broad posterior pseudo disc bulge/uncovering. Associated annular fissuring. Advanced bilateral facet arthrosis. Resultant mild narrowing of the lateral recesses bilaterally. Central canal remains patent. Mild to moderate bilateral L4 foraminal stenosis.   L5-S1: Mild disc bulge. Mild right  greater left facet hypertrophy. No spinal stenosis. Foramina remain patent.   IMPRESSION: 1. No acute abnormality within the lumbar spine. 2. 3 mm anterolisthesis of L3 on L4, 6 mm anterolisthesis of L4 on L5, with 2 mm anterolisthesis of L5 on S1. Findings are chronic and facet mediated. No visible pars defect by MRI. 3. Mild disc bulging with facet hypertrophy at L2-3 with resultant mild left lateral recess stenosis, with moderate left L2 foraminal narrowing. 4. Mild to moderate bilateral L3 and L4 foraminal stenosis related to disc bulge and facet disease. 5. Reactive marrow edema about the right L2-3 and L3-4 facets due to facet arthritis. Finding could contribute to lower back pain.     Electronically Signed   By: Morene Hoard M.D.   On: 12/28/2023 19:13     Objective:  VS:  HT:    WT:   BMI:     BP:137/84  HR:76bpm  TEMP: ( )  RESP:  Physical Exam Vitals and nursing note reviewed.  Constitutional:      General: He is not in acute distress.    Appearance: Normal appearance. He is not ill-appearing.  HENT:     Head: Normocephalic and atraumatic.     Right Ear: External ear normal.     Left Ear: External ear normal.     Nose: No congestion.  Eyes:     Extraocular Movements: Extraocular movements intact.  Cardiovascular:     Rate and Rhythm: Normal rate.  Pulses: Normal pulses.  Pulmonary:     Effort: Pulmonary effort is normal. No respiratory distress.  Abdominal:     General: There is no distension.     Palpations: Abdomen is soft.  Musculoskeletal:        General: No tenderness or signs of injury.     Cervical back: Neck supple.     Right lower leg: No edema.     Left lower leg: No edema.     Comments: Patient has good distal strength without clonus.  Skin:    Findings: No erythema or rash.  Neurological:     General: No focal deficit present.     Mental Status: He is alert and oriented to person, place, and time.     Sensory: No sensory  deficit.     Motor: No weakness or abnormal muscle tone.     Coordination: Coordination normal.  Psychiatric:        Mood and Affect: Mood normal.        Behavior: Behavior normal.      Imaging: XR C-ARM NO REPORT Result Date: 02/02/2024 Please see Notes tab for imaging impression.

## 2024-02-02 NOTE — Procedures (Signed)
 Lumbosacral Transforaminal Epidural Steroid Injection - Sub-Pedicular Approach with Fluoroscopic Guidance  Patient: Eddie Salazar      Date of Birth: 04-07-1961 MRN: [id number] PCP: Sheryle Carwin, MD      Visit Date: 02/02/2024   Universal Protocol:    Date/Time: 02/02/2024  Consent Given By: the patient  Position: PRONE  Additional Comments: Vital signs were monitored before and after the procedure. Patient was prepped and draped in the usual sterile fashion. The correct patient, procedure, and site was verified.   Injection Procedure Details:   Procedure diagnoses: Lumbar radiculopathy [M54.16]    Meds Administered:  Meds ordered this encounter  Medications   methylPREDNISolone acetate (DEPO-MEDROL) injection 40 mg    Laterality: Right  Location/Site: L4  Needle:5.0 in., 22 ga.  Short bevel or Quincke spinal needle  Needle Placement: Transforaminal  Findings:    -Comments: Excellent flow of contrast along the nerve, nerve root and into the epidural space.  Procedure Details: After squaring off the end-plates to get a true AP view, the C-arm was positioned so that an oblique view of the foramen as noted above was visualized. The target area is just inferior to the nose of the scotty dog or sub pedicular. The soft tissues overlying this structure were infiltrated with 2-3 ml. of 1% Lidocaine  without Epinephrine .  The spinal needle was inserted toward the target using a trajectory view along the fluoroscope beam.  Under AP and lateral visualization, the needle was advanced so it did not puncture dura and was located close the 6 O'Clock position of the pedical in AP tracterory. Biplanar projections were used to confirm position. Aspiration was confirmed to be negative for CSF and/or blood. A 1-2 ml. volume of Isovue -250 was injected and flow of contrast was noted at each level. Radiographs were obtained for documentation purposes.   After attaining the desired flow of  contrast documented above, a 0.5 to 1.0 ml test dose of 0.25% Marcaine  was injected into each respective transforaminal space.  The patient was observed for 90 seconds post injection.  After no sensory deficits were reported, and normal lower extremity motor function was noted,   the above injectate was administered so that equal amounts of the injectate were placed at each foramen (level) into the transforaminal epidural space.   Additional Comments:  The patient tolerated the procedure well Dressing: 2 x 2 sterile gauze and Band-Aid    Post-procedure details: Patient was observed during the procedure. Post-procedure instructions were reviewed.  Patient left the clinic in stable condition.

## 2024-02-07 ENCOUNTER — Encounter: Admitting: Physical Medicine and Rehabilitation

## 2024-02-08 NOTE — Therapy (Signed)
 OUTPATIENT PHYSICAL THERAPY THORACOLUMBAR EVALUATION   Patient Name: Eddie Salazar MRN: [id number] DOB:1960/11/23, 63 y.o., male Today's Date: 02/09/2024  END OF SESSION:  PT End of Session - 02/09/24 1245     Visit Number 1    Number of Visits 1    Authorization Type [employer] PPO    Authorization Time Period --    PT Start Time 1245    PT Stop Time 1325    PT Time Calculation (min) 40 min    Activity Tolerance Patient tolerated treatment well    Behavior During Therapy WFL for tasks assessed/performed          Past Medical History:  Diagnosis Date   Hypertension    Kidney stones    Past Surgical History:  Procedure Laterality Date   COLONOSCOPY  01/13/2012   Procedure: COLONOSCOPY;  Surgeon: Claudis RAYMOND Rivet, MD;  [facility];  Service: Endoscopy;  Laterality: N/A;  830   COLONOSCOPY N/A 05/19/2017   Procedure: COLONOSCOPY;  Surgeon: Rivet Claudis RAYMOND, MD;  Location: [facility];  Service: Endoscopy;  Laterality: N/A;  915   COLONOSCOPY WITH PROPOFOL  N/A 07/08/2022   Procedure: COLONOSCOPY WITH PROPOFOL ;  Surgeon: Eartha Angelia Sieving, MD;  Location: [facility];  Service: Gastroenterology;  Laterality: N/A;  730amn, asa 1-2   DISTAL BICEPS TENDON REPAIR  02/12/2012   Procedure: DISTAL BICEPS TENDON REPAIR;  Surgeon: Taft FORBES Minerva, MD;  Location: [facility];  Service: Orthopedics;  Laterality: Left;   FOOT SURGERY     HERNIA REPAIR     KNEE ARTHROSCOPY Right    NECK SURGERY     ROTATOR CUFF REPAIR     Patient Active Problem List   Diagnosis Date Noted   Hyperpigmented skin lesion 02/23/2019   Hyperlipidemia 02/23/2019   Hypertension 02/23/2019   Family history of colon cancer 02/16/2017   History of colonic polyps 02/16/2017   Osteoarthritis of left knee 04/27/2013   Biceps tendon tear 02/01/2012   LOOSE BODY-KNEE 06/25/2009   TEAR MEDIAL MENISCUS 06/25/2009   TEAR M C L 06/25/2009   KNEE, ARTHRITIS, DEGEN./OSTEO 06/18/2009   DERANGEMENT  MENISCUS 06/18/2009    ERE:Qjhjw, Gaither, MD  REFERRING PROVIDER: Georgina Ozell LABOR, MD  REFERRING DIAG: M54.16 (ICD-10-CM) - Radiculopathy, lumbar region  Rationale for Evaluation and Treatment: Rehabilitation  THERAPY DIAG:  Radiculopathy, lumbar region  ONSET DATE: chronic; 10 years ago  SUBJECTIVE:  SUBJECTIVE STATEMENT: Numbness and pain right leg started about 10 years ago; starts hurting quite a bit after standing more than 10 minutes; 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  Saw Margrette first and he referred to Dr. Georgina.  X-ray and recommended to try injections.  Got those last Wednesday.  He is feeling good/better.  Able to stand for 2 hours yesterday with a funeral service.  Slept better.  He did wake once due to pain last night.  Referred to therapy.  Feels he is 80% better  PERTINENT HISTORY:  HTN OA left knee  PAIN:  Are you having pain? Yes: NPRS scale: 0/10 currently; 4/10 last night Pain location: low back Pain description: numbness and tingling in legs and feet Aggravating factors: standing still Relieving factors: moving  PRECAUTIONS: None   WEIGHT BEARING RESTRICTIONS: No  FALLS:  Has patient fallen in last 6 months? 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  DIAGNOSTIC FINDINGS:  IMPRESSION: 1. No acute abnormality within the lumbar spine. 2. 3 mm anterolisthesis of L3 on L4, 6 mm anterolisthesis of L4 on L5, with 2 mm anterolisthesis of L5 on S1. Findings are chronic and facet mediated. No visible pars  defect by MRI. 3. Mild disc bulging with facet hypertrophy at L2-3 with resultant mild left lateral recess stenosis, with moderate left L2 foraminal narrowing. 4. Mild to moderate bilateral L3 and L4 foraminal stenosis related to disc bulge and facet disease. 5. Reactive marrow edema about the right L2-3 and L3-4 facets due to facet arthritis. Finding could contribute to lower back pain.     Electronically Signed   By: Morene Hoard M.D.   On: 12/28/2023 19:13  PATIENT SURVEYS:  Modified Oswestry:  MODIFIED OSWESTRY DISABILITY SCALE  Date: 02/09/2024 Score                                Total 2/50; 4 %   Interpretation of scores: Score Category Description  0-20% Minimal Disability The patient can cope with most living activities. Usually no treatment is indicated apart from advice on lifting, sitting and exercise  21-40% Moderate Disability The patient experiences more pain and difficulty with sitting, lifting and standing. Travel and social life are more difficult and they may be disabled from work. Personal care, sexual activity and sleeping are not grossly affected, and the patient can usually be managed by conservative means  41-60% Severe Disability Pain remains the main problem in this group, but activities of daily living are affected. These patients require a detailed investigation  61-80% Crippled Back pain impinges on all aspects of the patient's life. Positive intervention is required  81-100% Bed-bound These patients are either bed-bound or exaggerating their symptoms  Bluford FORBES Zoe DELENA Karon DELENA, et al. Surgery versus conservative management of stable thoracolumbar fracture: the PRESTO feasibility RCT. [place] ([place]): [employer]; 2021 Nov. [facility] Technology Assessment, No. 25.62.) Appendix 3, Oswestry Disability Index category descriptors. Available from: FindJewel[employer]cz  Minimally Clinically Important  Difference (MCID) = 12.8%  COGNITION: Overall cognitive status: Within functional limits for tasks assessed     SENSATION: WFL   POSTURE: rounded shoulders, forward head, and left  knee extension lag  PALPATION: No significant soreness  LUMBAR ROM:   AROM eval  Flexion Full; hands to floor  Extension 80% available  Right lateral flexion   Left lateral flexion   Right rotation full  Left rotation full   (Blank rows = not tested)  LOWER EXTREMITY ROM:     Active  Right eval Left eval  Hip flexion    Hip extension    Hip abduction    Hip adduction    Hip internal rotation    Hip external rotation    Knee flexion    Knee extension  Knee extension lag  Ankle dorsiflexion    Ankle plantarflexion    Ankle inversion    Ankle eversion     (Blank rows = not tested)  LOWER EXTREMITY MMT:    MMT Right eval Left eval  Hip flexion 5 5  Hip extension 4+ 4+  Hip abduction    Hip adduction    Hip internal rotation    Hip external rotation    Knee flexion 5 5  Knee extension 5 5 left knee extension lag  Ankle dorsiflexion 5 5  Ankle plantarflexion    Ankle inversion    Ankle eversion     (Blank rows = not tested)   FUNCTIONAL TESTS:  5 times sit to stand: 11 sec no UE assist  GAIT: Distance walked: 50 ft in clinic Assistive device utilized: None Level of assistance: Modified independence Comments: no significant gait deviation noted  TREATMENT DATE: 02/09/24 physical therapy evaluation and HEP instruction                                                                                                                                 PATIENT EDUCATION:  Education details: Patient educated on exam findings, POC, scope of PT, HEP. Person educated: Patient Education method: Explanation, Demonstration, and Handouts Education comprehension: verbalized understanding, returned demonstration, verbal cues required, and tactile cues required  HOME EXERCISE  PROGRAM: [place] 3  ASSESSMENT:  CLINICAL IMPRESSION: Patient is a 63 y.o. male who was seen today for physical therapy evaluation and treatment for M54.16 (ICD-10-CM) - Radiculopathy, lumbar region. Patient demonstrates minimal muscle weakness,  and fascial restrictions which are likely contributing to symptoms of pain and are negatively impacting patient ability to perform ADLs and functional mobility tasks. Patient will benefit from skilled physical therapy services to address these deficits to reduce pain and improve level of function with ADLs and functional mobility tasksd ; will benefit from HEP instruction and will discharge to HEP after today's visit   OBJECTIVE IMPAIRMENTS: decreased activity tolerance, decreased strength, and pain.   ACTIVITY LIMITATIONS: sitting and standing  PARTICIPATION LIMITATIONS: cleaning and occupation  REHAB POTENTIAL: Good  CLINICAL DECISION MAKING: Evolving/moderate complexity  EVALUATION COMPLEXITY: Moderate   GOALS: Goals reviewed with patient? No  SHORT TERM GOALS: Target date: 02/23/2024  patient will be independent with initial HEP and  compliant with HEP 3-4 times a week   Baseline: Goal status: met    PLAN:  PT FREQUENCY: 1x/week  PT DURATION: 1 week  PLANNED INTERVENTIONS: 97164- PT Re-evaluation, 97110-Therapeutic exercises, 97530- Therapeutic activity, [id number]- Neuromuscular re-education, 97535- Self Care, 02859- Manual therapy, [id number]- Gait training, [id number]- Orthotic Fit/training, [phone]- Canalith repositioning, [id number]- Aquatic Therapy, [phone]- Splinting, [phone]- Wound care (first 20 sq cm), 97598- Wound care (each additional 20 sq cm)Patient/Family education, Balance training, Stair training, Taping, Dry Needling, Joint mobilization, Joint manipulation, Spinal manipulation, Spinal mobilization, Scar mobilization, and DME instructions. SABRA  PLAN FOR NEXT SESSION: discharge to HEP only   1:52 PM, 02/09/24 Oden Lindaman Small Alby Schwabe MPT [facility] [place] [phone]

## 2024-02-09 ENCOUNTER — Ambulatory Visit: Admitting: Orthopedic Surgery

## 2024-02-09 ENCOUNTER — Ambulatory Visit (HOSPITAL_COMMUNITY): Attending: Orthopedic Surgery

## 2024-02-09 ENCOUNTER — Other Ambulatory Visit: Payer: Self-pay

## 2024-02-09 DIAGNOSIS — M5416 Radiculopathy, lumbar region: Secondary | ICD-10-CM | POA: Insufficient documentation

## 2024-02-09 NOTE — Progress Notes (Signed)
 Orthopedic Spine Surgery Office Note   Assessment: Patient is a 63 y.o. male with right leg numbness when standing or walking for several minutes. As a spondylolisthesis at L4/5 that may be causing L4 nerve irritation resulting in the numbness     Plan: -Patient has tried: tylenol , ibuprofen, lumbar steroid injection -Patient's right leg numbness did respond to a L4 injection.  His back pain also got better with that injection.  He estimates that he had about 80% relief with that injection.  If he gets sustained relief with that, could consider repeat injection in the future -Continue with PT -Patient should return to office on an as-needed basis     Patient expressed understanding of the plan and all questions were answered to the patient's satisfaction.    ___________________________________________________________________________     History:   Patient is a 63 y.o. male who presents today for follow-up on he has lumbar spine.  Patient has had low back pain.  He is also having numbness in the right lower extremity that develops when he is standing or walking.  He notes the numbness along the anterolateral thigh and anterior leg.  He does not have any symptoms in the left lower extremity.  After last visit, he got an injection with Dr. Eldonna.  He said that his symptoms are about 80% better since that injection.  He has been able to be more physically active as a result of the improvement in his symptoms.   Treatments tried: tylenol , ibuprofen, lumbar steroid injection    Physical Exam:   General: no acute distress, appears stated age Neurologic: alert, answering questions appropriately, following commands Respiratory: unlabored breathing on room air, symmetric chest rise Psychiatric: appropriate affect, normal cadence to speech     MSK (spine):   -Strength exam                                                   Left                  Right EHL                              5/5                   5/5 TA                                 5/5                  5/5 GSC                             5/5                  5/5 Knee extension            5/5                  5/5 Hip flexion                    5/5                  5/5   -  Sensory exam                           Sensation intact to light touch in L3-S1 nerve distributions of bilateral lower extremities   Imaging: XRs of the lumbar spine from 12/29/2023 were previously independently reviewed and interpreted, showing spondylolisthesis at L4/5.  Disc height loss at L4/5.  No other significant degenerative changes seen.  No fracture or dislocation seen.   MRI of the lumbar spine from 12/29/2023 was previously independently reviewed and interpreted, showing spondylolisthesis at L4/5.  DDD at L4/5.  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  No other significant stenosis seen.      Patient name: Eddie Salazar Patient MRN: [id number] Date of visit: 02/09/24

## 2024-02-21 ENCOUNTER — Encounter: Payer: Self-pay | Admitting: Radiology

## 2024-03-27 ENCOUNTER — Other Ambulatory Visit: Payer: Self-pay | Admitting: Physical Medicine and Rehabilitation

## 2024-03-27 ENCOUNTER — Telehealth: Payer: Self-pay | Admitting: Physical Medicine and Rehabilitation

## 2024-03-27 DIAGNOSIS — M5416 Radiculopathy, lumbar region: Secondary | ICD-10-CM

## 2024-03-27 NOTE — Telephone Encounter (Signed)
 Pt called requesting another injection. Last injection 10/25. Injection last until this Saturday. Please call pt at [phone].

## 2024-04-03 ENCOUNTER — Telehealth: Payer: Self-pay | Admitting: Orthopedic Surgery

## 2024-04-03 ENCOUNTER — Telehealth: Payer: Self-pay | Admitting: Physical Medicine and Rehabilitation

## 2024-04-03 NOTE — Telephone Encounter (Signed)
 Pt called saying that he won't have insurance after the first of the year due to job loss. He is wondering if he can get his injection done before then. Call back number is [phone]

## 2024-04-03 NOTE — Telephone Encounter (Signed)
 Pt called and left a message for Dr. Eldonna, but called back and wanted the same message left for Dr. Georgina. He says that his insurance is ending at the end of the year due to job loss and would like to have his injection done by then. Call back number is [phone].

## 2024-04-04 ENCOUNTER — Other Ambulatory Visit: Payer: Self-pay | Admitting: Physical Medicine and Rehabilitation

## 2024-04-04 DIAGNOSIS — M5416 Radiculopathy, lumbar region: Secondary | ICD-10-CM

## 2024-04-07 ENCOUNTER — Encounter: Payer: Self-pay | Admitting: Physical Medicine and Rehabilitation

## 2024-04-11 NOTE — Discharge Instructions (Signed)

## 2024-04-14 ENCOUNTER — Ambulatory Visit
Admission: RE | Admit: 2024-04-14 | Discharge: 2024-04-14 | Disposition: A | Discharge status: Home / Self Care | Source: Ambulatory Visit | Attending: Physical Medicine and Rehabilitation | Admitting: Physical Medicine and Rehabilitation

## 2024-04-14 DIAGNOSIS — M5416 Radiculopathy, lumbar region: Secondary | ICD-10-CM

## 2024-04-14 MED ORDER — METHYLPREDNISOLONE ACETATE 40 MG/ML INJ SUSP (RADIOLOG
80.0000 mg | Freq: Once | INTRAMUSCULAR | Status: AC
  Administered 2024-04-14: 80 mg via EPIDURAL

## 2024-04-14 MED ORDER — IOPAMIDOL (ISOVUE-M 200) INJECTION 41%
1.0000 mL | Freq: Once | INTRAMUSCULAR | Status: AC
  Administered 2024-04-14: 1 mL via EPIDURAL
